# Patient Record
Sex: Male | Born: 1955
Health system: Southern US, Community
[De-identification: ages and names within clinical notes are randomized; demographics above are authoritative.]

## PROBLEM LIST (undated history)

## (undated) DIAGNOSIS — R7303 Prediabetes: Secondary | ICD-10-CM

## (undated) DIAGNOSIS — M1990 Unspecified osteoarthritis, unspecified site: Secondary | ICD-10-CM

## (undated) DIAGNOSIS — I2510 Atherosclerotic heart disease of native coronary artery without angina pectoris: Secondary | ICD-10-CM

## (undated) DIAGNOSIS — G4730 Sleep apnea, unspecified: Secondary | ICD-10-CM

## (undated) DIAGNOSIS — E039 Hypothyroidism, unspecified: Secondary | ICD-10-CM

## (undated) HISTORY — PX: VASECTOMY: SHX75

---

## 2004-06-28 ENCOUNTER — Ambulatory Visit: Payer: Self-pay | Admitting: Family Medicine

## 2007-05-10 ENCOUNTER — Ambulatory Visit: Payer: Self-pay

## 2007-05-10 ENCOUNTER — Ambulatory Visit: Payer: Self-pay | Admitting: Family Medicine

## 2007-06-21 ENCOUNTER — Emergency Department: Payer: Self-pay | Admitting: Emergency Medicine

## 2007-06-24 ENCOUNTER — Emergency Department: Payer: Self-pay | Admitting: Internal Medicine

## 2007-06-25 ENCOUNTER — Ambulatory Visit: Payer: Self-pay | Admitting: Internal Medicine

## 2008-05-05 DIAGNOSIS — N529 Male erectile dysfunction, unspecified: Secondary | ICD-10-CM | POA: Insufficient documentation

## 2008-05-08 DIAGNOSIS — M129 Arthropathy, unspecified: Secondary | ICD-10-CM | POA: Insufficient documentation

## 2008-05-08 DIAGNOSIS — M109 Gout, unspecified: Secondary | ICD-10-CM | POA: Insufficient documentation

## 2009-02-17 DIAGNOSIS — IMO0002 Reserved for concepts with insufficient information to code with codable children: Secondary | ICD-10-CM | POA: Insufficient documentation

## 2009-03-24 ENCOUNTER — Ambulatory Visit: Payer: Self-pay | Admitting: Urology

## 2009-05-10 ENCOUNTER — Ambulatory Visit: Payer: Self-pay | Admitting: Rheumatology

## 2010-05-01 HISTORY — PX: COLONOSCOPY: SHX174

## 2010-06-17 LAB — HM COLONOSCOPY

## 2011-03-22 ENCOUNTER — Ambulatory Visit: Payer: Self-pay | Admitting: Family Medicine

## 2012-08-22 ENCOUNTER — Ambulatory Visit: Payer: Self-pay | Admitting: Family Medicine

## 2013-11-18 ENCOUNTER — Ambulatory Visit: Payer: Self-pay | Admitting: Family Medicine

## 2013-12-30 HISTORY — PX: KNEE SURGERY: SHX244

## 2014-01-13 ENCOUNTER — Other Ambulatory Visit: Payer: Self-pay | Admitting: Orthopedic Surgery

## 2014-01-13 DIAGNOSIS — M25561 Pain in right knee: Secondary | ICD-10-CM

## 2014-01-20 ENCOUNTER — Ambulatory Visit
Admission: RE | Admit: 2014-01-20 | Discharge: 2014-01-20 | Disposition: A | Discharge status: Home / Self Care | Payer: 59 | Source: Ambulatory Visit | Attending: Orthopedic Surgery | Admitting: Orthopedic Surgery

## 2014-01-20 DIAGNOSIS — M25561 Pain in right knee: Secondary | ICD-10-CM

## 2014-03-30 DIAGNOSIS — G4733 Obstructive sleep apnea (adult) (pediatric): Secondary | ICD-10-CM | POA: Insufficient documentation

## 2014-07-17 LAB — HEPATIC FUNCTION PANEL
ALK PHOS: 115 U/L (ref 25–125)
ALT: 27 U/L (ref 10–40)
AST: 26 U/L (ref 14–40)
BILIRUBIN, TOTAL: 0.8 mg/dL

## 2014-07-17 LAB — TSH: TSH: 4.88 u[IU]/mL (ref 0.41–5.90)

## 2014-07-17 LAB — BASIC METABOLIC PANEL
BUN: 17 mg/dL (ref 4–21)
Creatinine: 1.1 mg/dL (ref 0.6–1.3)
GLUCOSE: 116 mg/dL
Potassium: 5 mmol/L (ref 3.4–5.3)
SODIUM: 147 mmol/L (ref 137–147)

## 2014-07-17 LAB — HEMOGLOBIN A1C: Hgb A1c MFr Bld: 5.7 % (ref 4.0–6.0)

## 2015-02-26 ENCOUNTER — Other Ambulatory Visit: Payer: Self-pay | Admitting: Family Medicine

## 2015-02-26 DIAGNOSIS — Z205 Contact with and (suspected) exposure to viral hepatitis: Secondary | ICD-10-CM

## 2015-04-02 ENCOUNTER — Ambulatory Visit (INDEPENDENT_AMBULATORY_CARE_PROVIDER_SITE_OTHER): Payer: 59 | Admitting: Family Medicine

## 2015-04-02 ENCOUNTER — Encounter: Payer: Self-pay | Admitting: Family Medicine

## 2015-04-02 VITALS — BP 110/70 | HR 86 | Temp 97.9°F | Resp 16 | Wt 306.0 lb

## 2015-04-02 DIAGNOSIS — R053 Chronic cough: Secondary | ICD-10-CM

## 2015-04-02 DIAGNOSIS — E039 Hypothyroidism, unspecified: Secondary | ICD-10-CM | POA: Insufficient documentation

## 2015-04-02 DIAGNOSIS — E7800 Pure hypercholesterolemia, unspecified: Secondary | ICD-10-CM | POA: Insufficient documentation

## 2015-04-02 DIAGNOSIS — I2510 Atherosclerotic heart disease of native coronary artery without angina pectoris: Secondary | ICD-10-CM | POA: Insufficient documentation

## 2015-04-02 DIAGNOSIS — I059 Rheumatic mitral valve disease, unspecified: Secondary | ICD-10-CM | POA: Insufficient documentation

## 2015-04-02 DIAGNOSIS — L309 Dermatitis, unspecified: Secondary | ICD-10-CM | POA: Insufficient documentation

## 2015-04-02 DIAGNOSIS — K602 Anal fissure, unspecified: Secondary | ICD-10-CM | POA: Insufficient documentation

## 2015-04-02 DIAGNOSIS — I10 Essential (primary) hypertension: Secondary | ICD-10-CM | POA: Insufficient documentation

## 2015-04-02 DIAGNOSIS — K279 Peptic ulcer, site unspecified, unspecified as acute or chronic, without hemorrhage or perforation: Secondary | ICD-10-CM | POA: Insufficient documentation

## 2015-04-02 DIAGNOSIS — R05 Cough: Secondary | ICD-10-CM

## 2015-04-02 DIAGNOSIS — R7303 Prediabetes: Secondary | ICD-10-CM | POA: Insufficient documentation

## 2015-04-02 DIAGNOSIS — F419 Anxiety disorder, unspecified: Secondary | ICD-10-CM | POA: Insufficient documentation

## 2015-04-02 DIAGNOSIS — I38 Endocarditis, valve unspecified: Secondary | ICD-10-CM | POA: Insufficient documentation

## 2015-04-02 DIAGNOSIS — M25569 Pain in unspecified knee: Secondary | ICD-10-CM | POA: Insufficient documentation

## 2015-04-02 DIAGNOSIS — M7660 Achilles tendinitis, unspecified leg: Secondary | ICD-10-CM | POA: Insufficient documentation

## 2015-04-02 DIAGNOSIS — B356 Tinea cruris: Secondary | ICD-10-CM | POA: Insufficient documentation

## 2015-04-02 MED ORDER — FLUNISOLIDE HFA 80 MCG/ACT IN AERS: 2.0000 | INHALATION_SPRAY | Freq: Two times a day (BID) | RESPIRATORY_TRACT | Status: DC

## 2015-04-02 MED ORDER — HYDROCODONE-HOMATROPINE 5-1.5 MG/5ML PO SYRP: 5.0000 mL | ORAL_SOLUTION | Freq: Three times a day (TID) | ORAL | Status: DC | PRN

## 2015-04-02 NOTE — Progress Notes (Signed)
Patient ID: Michael Reyes, male   DOB: July 13, 1955, 59 y.o.   MRN: [id number]       Patient: Michael Reyes Male    DOB: [birth year]   59 y.o.   MRN: [id number] Visit Date: 04/02/2015  Today's Provider: Vernie Murders, PA   Chief Complaint  Patient presents with  . URI   Subjective:    HPI Pt is here today because he has had cold symptoms for about 4 weeks. He has had cough, congestion, shortness of breath and wheezing. He reports that he has tried taking Mucinex and it has not helped him at all and last night he could not sleep for coughing and today he feels hot like he has a fever. Pt is concerned that his CPAP is "re-infecting" him with this cold. Occasional sore throat and ears feel stopped up.   Patient Active Problem List   Diagnosis Date Noted  . Achilles tendinitis 04/02/2015  . Anal fissure 04/02/2015  . Anxiety 04/02/2015  . Arteriosclerosis of coronary artery 04/02/2015  . Gonalgia 04/02/2015  . Dermatitis, eczematoid 04/02/2015  . Blood glucose elevated 04/02/2015  . HLD (hyperlipidemia) 04/02/2015  . BP (high blood pressure) 04/02/2015  . Adult hypothyroidism 04/02/2015  . Disorder of mitral valve 04/02/2015  . Gastroduodenal ulcer 04/02/2015  . Subclinical hypothyroidism 04/02/2015  . Dermatophytosis of groin 04/02/2015  . Heart valve disease 04/02/2015  . Exposure to hepatitis B 02/26/2015  . Obstructive apnea 03/30/2014  . Coitalgia 02/17/2009  . Arthropathia 05/08/2008  . Arthritis due to gout 05/08/2008  . Hypercholesteremia 05/05/2008  . ED (erectile dysfunction) of organic origin 05/05/2008  . Apnea, sleep 05/05/2008   Past Surgical History  Procedure Laterality Date  . Vasectomy    . Knee surgery Right 12/2013  . Colonoscopy  2012   Family History  Problem Relation Age of Onset  . Tongue cancer Mother   . Heart disease Father   . Diabetes Father   . Obesity Sister   . Cancer Sister   . Diabetes Sister   . Healthy Brother    No Known  Allergies   Previous Medications   ALLOPURINOL (ZYLOPRIM) 100 MG TABLET    Take 2 tablets by mouth  once a day as directed   ASPIRIN 81 MG TABLET       GLUCOSAMINE-CHONDROIT-VIT C-MN PO    Take by mouth.   MELOXICAM (MOBIC) 15 MG TABLET    Take by mouth.   MULTIPLE VITAMIN PO    Take by mouth.   SIMVASTATIN (ZOCOR) 40 MG TABLET    Take 1 tablet by mouth  every night    Review of Systems  Constitutional: Positive for fatigue.  HENT: Positive for congestion, ear pain, postnasal drip, rhinorrhea, sneezing and sore throat.   Eyes: Positive for pain.  Respiratory: Positive for cough, shortness of breath and wheezing.   Cardiovascular: Positive for chest pain (when he coughs from sore muscles.).  Gastrointestinal: Negative.   Endocrine: Negative.   Genitourinary: Negative.   Musculoskeletal: Negative.   Skin: Negative.   Allergic/Immunologic: Negative.   Neurological: Negative.   Hematological: Negative.   Psychiatric/Behavioral: Negative.     Social History  Substance Use Topics  . Smoking status: Never Smoker   . Smokeless tobacco: Never Used  . Alcohol Use: 0.0 oz/week    0 Standard drinks or equivalent per week     Comment: Occasionally   Objective:   BP 110/70 mmHg  Pulse 86  Temp(Src) 97.9 F (36.6  C) (Oral)  Resp 16  Wt 306 lb (138.801 kg)  SpO2 94%  Physical Exam  Constitutional: He is oriented to person, place, and time. He appears well-developed and well-nourished.  HENT:  Head: Normocephalic.  Right Ear: External ear normal.  Left Ear: External ear normal.  Nose: Nose normal.  Mouth/Throat: Oropharynx is clear and moist.  Eyes: Conjunctivae and EOM are normal.  Neck: Normal range of motion. Neck supple.  Cardiovascular: Normal rate, regular rhythm and normal heart sounds.   Pulmonary/Chest: Effort normal and breath sounds normal. He has no wheezes. He has no rales.  Abdominal: Soft. Bowel sounds are normal.  Neurological: He is alert and oriented to  person, place, and time.  Psychiatric: He has a normal mood and affect. His behavior is normal.      Assessment & Plan:     1. Persistent cough for 3 weeks or longer Onset over the past 3-4 weeks. Ticklish and no sputum production or fever. Worried about exposing his wife to any infection since she is being treated for lymphoma with chemotherapy. Will give Azithromycin prescription to start if any fever develops and add Hycodan for cough. Given Aerospan inhaler to decrease inflammation in airways. If no better in 5-6 days, should return for spirometry, chest x-ray and labs. Increase fluid intake. - HYDROcodone-homatropine (HYCODAN) 5-1.5 MG/5ML syrup; Take 5 mLs by mouth every 8 (eight) hours as needed for cough.  Dispense: 120 mL; Refill: 0 - Flunisolide HFA 80 MCG/ACT AERS; Inhale 2 Act into the lungs 2 (two) times daily.  Dispense: 5.1 g; Refill: 0       Vernie Murders, PA  [facility]

## 2015-04-16 ENCOUNTER — Telehealth: Payer: Self-pay | Admitting: Family Medicine

## 2015-04-16 NOTE — Telephone Encounter (Signed)
Pt still has some cough and wants to know if he can get a refill on the HYDROcodone-homatropine (HYCODAN) 5-1.5 MG/5ML syrup  He uses [facility] [address]  Call back is [phone]  Thanks Con Memos

## 2015-04-16 NOTE — Telephone Encounter (Signed)
Advised patient he needs follow up appointment since cough returned from last visit on 04-02-15. May need further evaluation with chest x-ray and spirometry. States he will call for appointment the first of next week.

## 2015-04-19 ENCOUNTER — Ambulatory Visit (INDEPENDENT_AMBULATORY_CARE_PROVIDER_SITE_OTHER): Payer: 59 | Admitting: Family Medicine

## 2015-04-19 ENCOUNTER — Encounter: Payer: Self-pay | Admitting: Family Medicine

## 2015-04-19 VITALS — BP 136/84 | HR 80 | Temp 97.9°F | Resp 20 | Ht 75.0 in | Wt 300.0 lb

## 2015-04-19 DIAGNOSIS — R05 Cough: Secondary | ICD-10-CM

## 2015-04-19 DIAGNOSIS — G4733 Obstructive sleep apnea (adult) (pediatric): Secondary | ICD-10-CM

## 2015-04-19 DIAGNOSIS — R059 Cough, unspecified: Secondary | ICD-10-CM

## 2015-04-19 MED ORDER — HYDROCODONE-HOMATROPINE 5-1.5 MG/5ML PO SYRP: 5.0000 mL | ORAL_SOLUTION | Freq: Three times a day (TID) | ORAL | Status: DC | PRN

## 2015-04-19 NOTE — Progress Notes (Signed)
Patient ID: Michael Reyes, male   DOB: [birth year], 59 y.o.   MRN: [id number]        Patient: Michael Reyes Male    DOB: 1955/11/29   59 y.o.   MRN: [id number] Visit Date: 04/19/2015  Today's Provider: Vernie Murders, PA   Chief Complaint  Patient presents with  . URI   Subjective:    URI  This is a new problem. The current episode started 1 to 4 weeks ago (03/05/2015). The problem has been unchanged. There has been no fever. Associated symptoms include congestion, coughing, rhinorrhea, a sore throat and wheezing. He has tried nothing for the symptoms.  Patient reports that his cough is worse at night. Patient reports he has used Flunisolide inhaler with mild relief. Patient reports persistent dry cough that improved with Aerospan and Hycodan..  Patient Active Problem List   Diagnosis Date Noted  . Achilles tendinitis 04/02/2015  . Anal fissure 04/02/2015  . Anxiety 04/02/2015  . Arteriosclerosis of coronary artery 04/02/2015  . Gonalgia 04/02/2015  . Dermatitis, eczematoid 04/02/2015  . Blood glucose elevated 04/02/2015  . HLD (hyperlipidemia) 04/02/2015  . BP (high blood pressure) 04/02/2015  . Adult hypothyroidism 04/02/2015  . Disorder of mitral valve 04/02/2015  . Gastroduodenal ulcer 04/02/2015  . Subclinical hypothyroidism 04/02/2015  . Dermatophytosis of groin 04/02/2015  . Heart valve disease 04/02/2015  . Exposure to hepatitis B 02/26/2015  . Obstructive apnea 03/30/2014  . Coitalgia 02/17/2009  . Arthropathia 05/08/2008  . Arthritis due to gout 05/08/2008  . Hypercholesteremia 05/05/2008  . ED (erectile dysfunction) of organic origin 05/05/2008  . Apnea, sleep 05/05/2008   Past Surgical History  Procedure Laterality Date  . Vasectomy    . Knee surgery Right 12/2013  . Colonoscopy  2012   Family History  Problem Relation Age of Onset  . Tongue cancer Mother   . Heart disease Father   . Diabetes Father   . Obesity Sister   . Cancer Sister   .  Diabetes Sister   . Healthy Brother    No Known Allergies   Previous Medications   ALLOPURINOL (ZYLOPRIM) 100 MG TABLET    Take 2 tablets by mouth  once a day as directed   ASPIRIN 81 MG TABLET    Take 81 mg by mouth daily.    FLUNISOLIDE HFA 80 MCG/ACT AERS    Inhale 2 Act into the lungs 2 (two) times daily.   GLUCOSAMINE-CHONDROIT-VIT C-MN PO    Take 2 tablets by mouth daily.    MULTIPLE VITAMIN PO    Take 1 tablet by mouth daily.    SIMVASTATIN (ZOCOR) 40 MG TABLET    Take 1 tablet by mouth  every night    Review of Systems  Constitutional: Negative.   HENT: Positive for congestion, rhinorrhea and sore throat.   Respiratory: Positive for cough and wheezing.   Cardiovascular: Negative.     Social History  Substance Use Topics  . Smoking status: Never Smoker   . Smokeless tobacco: Never Used  . Alcohol Use: 0.0 oz/week    0 Standard drinks or equivalent per week     Comment: Occasionally   Objective:   BP 136/84 mmHg  Pulse 80  Temp(Src) 97.9 F (36.6 C) (Oral)  Resp 20  Wt 300 lb (136.079 kg)  SpO2 97%  Physical Exam  Constitutional: He is oriented to person, place, and time. He appears well-developed and well-nourished.  HENT:  Head: Normocephalic.  Right Ear: External  ear normal.  Left Ear: External ear normal.  Nose: Nose normal.  Mouth/Throat: Oropharynx is clear and moist.  Eyes: Conjunctivae and EOM are normal.  Neck: Normal range of motion. Neck supple.  Cardiovascular: Normal rate, regular rhythm and normal heart sounds.   Pulmonary/Chest: Effort normal and breath sounds normal.  Abdominal: Soft. Bowel sounds are normal.  Neurological: He is alert and oriented to person, place, and time.  Skin: No rash noted.  Psychiatric: He has a normal mood and affect. His behavior is normal. Thought content normal.      Assessment & Plan:     1. Cough Recurrent and persistent hacking cough over the past 4-6 weeks. Was better with Aerospan and Hycodan.  Spirometry normal today. Will refill Aerospan and may get Hycodan if not getting enough relief with Tussin-DM. Increase fluid intake, proceed with labs and CXR. Recheck pending reports.  - Spirometry with Graph - DG Chest 2 View - CBC with Differential/Platelet - COMPLETE METABOLIC PANEL WITH GFR - HYDROcodone-homatropine (HYCODAN) 5-1.5 MG/5ML syrup; Take 5 mLs by mouth every 8 (eight) hours as needed for cough.  Dispense: 120 mL; Refill: 0  2. Obstructive apnea Well controlled with CPAP each night.       Vernie Murders, PA  [facility]

## 2015-04-20 ENCOUNTER — Telehealth: Payer: Self-pay

## 2015-04-20 ENCOUNTER — Ambulatory Visit
Admission: RE | Admit: 2015-04-20 | Discharge: 2015-04-20 | Disposition: A | Discharge status: Home / Self Care | Payer: 59 | Source: Ambulatory Visit | Attending: Family Medicine | Admitting: Family Medicine

## 2015-04-20 DIAGNOSIS — R05 Cough: Secondary | ICD-10-CM | POA: Insufficient documentation

## 2015-04-20 DIAGNOSIS — R918 Other nonspecific abnormal finding of lung field: Secondary | ICD-10-CM | POA: Diagnosis not present

## 2015-04-20 NOTE — Telephone Encounter (Signed)
-----   Message from [place], [place] sent at 04/20/2015  1:24 PM EST ----- Minimal area of atelectasis (may be due to inflammation, infection or old scar). Proceed with present medication and awaiting final report of blood tests.

## 2015-04-20 NOTE — Telephone Encounter (Signed)
Patient advised as directed below. Patient verbalized understanding.  

## 2015-04-21 LAB — CBC WITH DIFFERENTIAL/PLATELET
BASOS ABS: 0 10*3/uL (ref 0.0–0.2)
Basos: 0 %
EOS (ABSOLUTE): 0.2 10*3/uL (ref 0.0–0.4)
Eos: 4 %
Hematocrit: 46.6 % (ref 37.5–51.0)
Hemoglobin: 16.5 g/dL (ref 12.6–17.7)
IMMATURE GRANS (ABS): 0 10*3/uL (ref 0.0–0.1)
IMMATURE GRANULOCYTES: 1 %
LYMPHS: 30 %
Lymphocytes Absolute: 1.6 10*3/uL (ref 0.7–3.1)
MCH: 32.9 pg (ref 26.6–33.0)
MCHC: 35.4 g/dL (ref 31.5–35.7)
MCV: 93 fL (ref 79–97)
Monocytes Absolute: 0.7 10*3/uL (ref 0.1–0.9)
Monocytes: 14 %
NEUTROS PCT: 51 %
Neutrophils Absolute: 2.8 10*3/uL (ref 1.4–7.0)
PLATELETS: 233 10*3/uL (ref 150–379)
RBC: 5.02 x10E6/uL (ref 4.14–5.80)
RDW: 13.1 % (ref 12.3–15.4)
WBC: 5.3 10*3/uL (ref 3.4–10.8)

## 2015-04-21 LAB — COMPREHENSIVE METABOLIC PANEL
ALT: 38 IU/L (ref 0–44)
AST: 24 IU/L (ref 0–40)
Albumin/Globulin Ratio: 2 (ref 1.1–2.5)
Albumin: 4.6 g/dL (ref 3.6–4.8)
Alkaline Phosphatase: 101 IU/L (ref 39–117)
BUN/Creatinine Ratio: 16 (ref 10–22)
BUN: 17 mg/dL (ref 8–27)
Bilirubin Total: 0.4 mg/dL (ref 0.0–1.2)
CALCIUM: 9.3 mg/dL (ref 8.6–10.2)
CO2: 24 mmol/L (ref 18–29)
CREATININE: 1.07 mg/dL (ref 0.76–1.27)
Chloride: 102 mmol/L (ref 96–106)
GFR, EST AFRICAN AMERICAN: 86 mL/min/{1.73_m2} (ref >59)
GFR, EST NON AFRICAN AMERICAN: 75 mL/min/{1.73_m2} (ref >59)
Globulin, Total: 2.3 g/dL (ref 1.5–4.5)
Glucose: 113 mg/dL — ABNORMAL HIGH (ref 65–99)
POTASSIUM: 4.5 mmol/L (ref 3.5–5.2)
Sodium: 142 mmol/L (ref 134–144)
TOTAL PROTEIN: 6.9 g/dL (ref 6.0–8.5)

## 2015-04-21 LAB — HEPATITIS B SURFACE ANTIGEN: HEP B S AG: NEGATIVE

## 2015-04-21 LAB — HEPATITIS B CORE ANTIBODY, TOTAL: HEP B C TOTAL AB: NEGATIVE

## 2015-04-21 LAB — HEPATITIS B SURFACE ANTIBODY,QUALITATIVE: Hep B Surface Ab, Qual: NONREACTIVE

## 2015-04-29 ENCOUNTER — Telehealth: Payer: Self-pay

## 2015-04-29 NOTE — Telephone Encounter (Signed)
LMTCB

## 2015-04-29 NOTE — Telephone Encounter (Signed)
-----   Message from [place], [place] sent at 04/26/2015  1:44 PM EST ----- All blood tests normal. If cough no better with recent medications, will need to schedule pulmonology referral for further evaluation.

## 2015-04-29 NOTE — Telephone Encounter (Signed)
Patient advised as directed below. Patient verbalized understanding.  

## 2015-07-27 DIAGNOSIS — I872 Venous insufficiency (chronic) (peripheral): Secondary | ICD-10-CM | POA: Insufficient documentation

## 2015-08-04 ENCOUNTER — Telehealth: Payer: Self-pay | Admitting: Family Medicine

## 2015-08-04 DIAGNOSIS — E785 Hyperlipidemia, unspecified: Secondary | ICD-10-CM

## 2015-08-04 DIAGNOSIS — M109 Gout, unspecified: Secondary | ICD-10-CM

## 2015-08-04 NOTE — Telephone Encounter (Signed)
Ok to order. Thanks.   

## 2015-08-04 NOTE — Telephone Encounter (Signed)
Pt is requesting a lab slip for cholesterol panel and uric acid level checked.  Pt states Dr Nehemiah Massed is requesting these done.  CB#[phone]/MW

## 2015-08-04 NOTE — Telephone Encounter (Signed)
Labs ordered, lab slipped print at front desk for pick up. Patient is aware.

## 2015-08-05 DIAGNOSIS — E785 Hyperlipidemia, unspecified: Secondary | ICD-10-CM | POA: Diagnosis not present

## 2015-08-05 DIAGNOSIS — M109 Gout, unspecified: Secondary | ICD-10-CM | POA: Diagnosis not present

## 2015-08-06 ENCOUNTER — Telehealth: Payer: Self-pay

## 2015-08-06 LAB — LIPID PANEL
CHOL/HDL RATIO: 3.8 ratio (ref 0.0–5.0)
Cholesterol, Total: 149 mg/dL (ref 100–199)
HDL: 39 mg/dL — AB (ref >39)
LDL Calculated: 78 mg/dL (ref 0–99)
Triglycerides: 162 mg/dL — ABNORMAL HIGH (ref 0–149)
VLDL CHOLESTEROL CAL: 32 mg/dL (ref 5–40)

## 2015-08-06 LAB — URIC ACID: URIC ACID: 4.9 mg/dL (ref 3.7–8.6)

## 2015-08-06 NOTE — Telephone Encounter (Signed)
-----   Message from Margarita Rana, MD sent at 08/06/2015  9:36 AM EDT ----- Labs stable. Cholesterol is 149 and uric acid at 4.9. Both at goal. Continue current medication and plan of care.  Thanks.

## 2015-08-06 NOTE — Telephone Encounter (Signed)
LMTCB 08/06/2015  Thanks,   -Mickel Baas

## 2015-08-09 ENCOUNTER — Telehealth: Payer: Self-pay | Admitting: Family Medicine

## 2015-08-09 NOTE — Telephone Encounter (Signed)
See other message-aa 

## 2015-08-09 NOTE — Telephone Encounter (Signed)
Pt advised-aa 

## 2015-08-09 NOTE — Telephone Encounter (Signed)
Pt states he is returning a call from one of Dr. Sharyon Medicus nurse's. Pt would like a nurse to please return his call @CB # [phone].   Thanks CC

## 2015-08-23 ENCOUNTER — Other Ambulatory Visit: Payer: Self-pay

## 2015-08-23 DIAGNOSIS — E7800 Pure hypercholesterolemia, unspecified: Secondary | ICD-10-CM

## 2015-08-23 MED ORDER — ALLOPURINOL 100 MG PO TABS: ORAL_TABLET | ORAL | Status: DC

## 2015-08-23 MED ORDER — SIMVASTATIN 40 MG PO TABS: 40.0000 mg | ORAL_TABLET | Freq: Every day | ORAL | Status: DC

## 2015-10-01 ENCOUNTER — Encounter: Payer: Self-pay | Admitting: Family Medicine

## 2015-10-01 ENCOUNTER — Ambulatory Visit
Admission: RE | Admit: 2015-10-01 | Discharge: 2015-10-01 | Disposition: A | Discharge status: Home / Self Care | Payer: BLUE CROSS/BLUE SHIELD | Source: Ambulatory Visit | Attending: Family Medicine | Admitting: Family Medicine

## 2015-10-01 ENCOUNTER — Telehealth: Payer: Self-pay

## 2015-10-01 ENCOUNTER — Ambulatory Visit (INDEPENDENT_AMBULATORY_CARE_PROVIDER_SITE_OTHER): Payer: BLUE CROSS/BLUE SHIELD | Admitting: Family Medicine

## 2015-10-01 ENCOUNTER — Other Ambulatory Visit: Payer: Self-pay

## 2015-10-01 VITALS — BP 106/64 | HR 80 | Temp 98.1°F | Resp 16 | Wt 303.0 lb

## 2015-10-01 DIAGNOSIS — R05 Cough: Secondary | ICD-10-CM

## 2015-10-01 DIAGNOSIS — R053 Chronic cough: Secondary | ICD-10-CM | POA: Insufficient documentation

## 2015-10-01 DIAGNOSIS — R059 Cough, unspecified: Secondary | ICD-10-CM | POA: Insufficient documentation

## 2015-10-01 DIAGNOSIS — J309 Allergic rhinitis, unspecified: Secondary | ICD-10-CM | POA: Diagnosis not present

## 2015-10-01 MED ORDER — MONTELUKAST SODIUM 10 MG PO TABS: 10.0000 mg | ORAL_TABLET | Freq: Every day | ORAL | Status: DC

## 2015-10-01 MED ORDER — LORATADINE 10 MG PO TABS: 10.0000 mg | ORAL_TABLET | Freq: Every day | ORAL | Status: DC

## 2015-10-01 NOTE — Telephone Encounter (Signed)
Pt advised.   Thanks,   -Laura  

## 2015-10-01 NOTE — Progress Notes (Signed)
Subjective:    Patient ID: Michael Reyes, male    DOB: 1955/10/03, 60 y.o.   MRN: [id number]  Cough The current episode started more than 1 month ago (x 7 months). The problem occurs constantly. The cough is non-productive. Associated symptoms include postnasal drip and a sore throat. Pertinent negatives include no chest pain, chills, ear congestion, ear pain, fever, headaches, heartburn, hemoptysis, myalgias, nasal congestion, rhinorrhea, shortness of breath, sweats, weight loss or wheezing. Nothing aggravates the symptoms. Treatments tried: Gailen Shelter. The treatment provided no relief (Pt reports he quit taking these medications because they were ineffective). His past medical history is significant for environmental allergies (seasonal; was on allergy shots for this 4 years ago. Had these shots for about 10 years.). There is no history of asthma or COPD.  Pt does not have a H/O smoking or second hand smoke. Pt saw Simona Huh in December for this problem. Had a normal spirometry at that time, and pt's CXR showed minimal area of atelectasis. Labs were normal. Simona Huh suggested pt see pulmonology.     Review of Systems  Constitutional: Negative for fever, chills and weight loss.  HENT: Positive for postnasal drip and sore throat. Negative for ear pain and rhinorrhea.   Respiratory: Positive for cough. Negative for hemoptysis, shortness of breath and wheezing.   Cardiovascular: Negative for chest pain.  Gastrointestinal: Negative for heartburn.  Musculoskeletal: Negative for myalgias.  Allergic/Immunologic: Positive for environmental allergies (seasonal; was on allergy shots for this 4 years ago. Had these shots for about 10 years.).  Neurological: Negative for headaches.   BP 106/64 mmHg  Pulse 80  Temp(Src) 98.1 F (36.7 C) (Oral)  Resp 16  Wt 303 lb (137.44 kg)  SpO2 93%   Patient Active Problem List   Diagnosis Date Noted  . Venous insufficiency of leg 07/27/2015  . Achilles  tendinitis 04/02/2015  . Anal fissure 04/02/2015  . Anxiety 04/02/2015  . Arteriosclerosis of coronary artery 04/02/2015  . Gonalgia 04/02/2015  . Dermatitis, eczematoid 04/02/2015  . Blood glucose elevated 04/02/2015  . HLD (hyperlipidemia) 04/02/2015  . BP (high blood pressure) 04/02/2015  . Adult hypothyroidism 04/02/2015  . Disorder of mitral valve 04/02/2015  . Gastroduodenal ulcer 04/02/2015  . Subclinical hypothyroidism 04/02/2015  . Dermatophytosis of groin 04/02/2015  . Heart valve disease 04/02/2015  . Exposure to hepatitis B 02/26/2015  . Obstructive apnea 03/30/2014  . Coitalgia 02/17/2009  . Arthropathia 05/08/2008  . Arthritis due to gout 05/08/2008  . Hypercholesteremia 05/05/2008  . ED (erectile dysfunction) of organic origin 05/05/2008  . Apnea, sleep 05/05/2008   No past medical history on file. Current Outpatient Prescriptions on File Prior to Visit  Medication Sig  . allopurinol (ZYLOPRIM) 100 MG tablet Take 2 tablets by mouth  once a day as directed  . aspirin 81 MG tablet Take 81 mg by mouth daily.   Marland Kitchen GLUCOSAMINE-CHONDROIT-VIT C-MN PO Take 2 tablets by mouth daily.   . MULTIPLE VITAMIN PO Take 1 tablet by mouth daily.   . simvastatin (ZOCOR) 40 MG tablet Take 1 tablet (40 mg total) by mouth daily.   No current facility-administered medications on file prior to visit.   No Known Allergies Past Surgical History  Procedure Laterality Date  . Vasectomy    . Knee surgery Right 12/2013  . Colonoscopy  2012   Social History   Social History  . Marital Status: Married    Spouse Name: N/A  . Number of Children: N/A  .  Years of Education: N/A   Occupational History  . Not on file.   Social History Main Topics  . Smoking status: Never Smoker   . Smokeless tobacco: Never Used  . Alcohol Use: 0.0 oz/week    0 Standard drinks or equivalent per week     Comment: Occasionally  . Drug Use: No  . Sexual Activity: Not on file   Other Topics Concern    . Not on file   Social History Narrative   Family History  Problem Relation Age of Onset  . Tongue cancer Mother   . Heart disease Father   . Diabetes Father   . Obesity Sister   . Cancer Sister   . Diabetes Sister   . Healthy Brother        Objective:   Physical Exam  Constitutional: He appears well-developed and well-nourished.  HENT:  Head: Normocephalic and atraumatic.  Right Ear: Tympanic membrane normal.  Left Ear: Tympanic membrane normal.  Nose: Nose normal.  Mouth/Throat: Oropharynx is clear and moist.  Psychiatric: He has a normal mood and affect. His behavior is normal.  BP 106/64 mmHg  Pulse 80  Temp(Src) 98.1 F (36.7 C) (Oral)  Resp 16  Wt 303 lb (137.44 kg)  SpO2 93%     Assessment & Plan:  1. Allergic rhinitis, unspecified allergic rhinitis type New problem. Most likely cause of cough. Has H/O AR. Is c/o skin histamine reactions. Start Singulair and Claritin as below.  Patient instructed to call back if condition worsens or does not improve.    - montelukast (SINGULAIR) 10 MG tablet; Take 1 tablet (10 mg total) by mouth at bedtime.  Dispense: 30 tablet; Refill: 3 - loratadine (CLARITIN) 10 MG tablet; Take 1 tablet (10 mg total) by mouth daily.  Dispense: 30 tablet; Refill: 11  2. Chronic cough Suspect is related to AR. Spirometry WNL. Will order CXR to R/O pulmonary disease.  If normal, but cough does not improve, will order CT scan.   - Spirometry with Graph - DG Chest 2 View; Future   Patient seen and examined by Jerrell Belfast, MD, and note scribed by Renaldo Fiddler, CMA.   I have reviewed the document for accuracy and completeness and I agree with above. Jerrell Belfast, MD   Margarita Rana, MD

## 2015-10-01 NOTE — Telephone Encounter (Signed)
-----   Message from Margarita Rana, MD sent at 10/01/2015  3:28 PM EDT ----- CXR normal. Please notify patient. Would recommend proceed with chest CT if cough does not resolve with treatment. Thanks.

## 2015-10-18 ENCOUNTER — Telehealth: Payer: Self-pay | Admitting: Family Medicine

## 2015-10-18 ENCOUNTER — Telehealth: Payer: Self-pay

## 2015-10-18 ENCOUNTER — Ambulatory Visit (INDEPENDENT_AMBULATORY_CARE_PROVIDER_SITE_OTHER): Payer: BLUE CROSS/BLUE SHIELD | Admitting: Family Medicine

## 2015-10-18 ENCOUNTER — Encounter: Payer: Self-pay | Admitting: Family Medicine

## 2015-10-18 ENCOUNTER — Ambulatory Visit: Payer: Self-pay | Admitting: Family Medicine

## 2015-10-18 VITALS — BP 126/76 | HR 72 | Temp 97.5°F | Resp 16 | Wt 309.0 lb

## 2015-10-18 DIAGNOSIS — R05 Cough: Secondary | ICD-10-CM | POA: Diagnosis not present

## 2015-10-18 DIAGNOSIS — R053 Chronic cough: Secondary | ICD-10-CM

## 2015-10-18 MED ORDER — OMEPRAZOLE 20 MG PO CPDR: 20.0000 mg | DELAYED_RELEASE_CAPSULE | Freq: Two times a day (BID) | ORAL | Status: DC

## 2015-10-18 NOTE — Telephone Encounter (Signed)
Pt calledsaying he was bringing his mother in law in at 3:30 today.  He was supposed to fu with you.  He wants to know if you can see him when he brings her in so he does not have to come in twice.  There is not an opening unless we use the 3:45 blocked space.     Please advise.  His call back is [phone].  ThanksTeri

## 2015-10-18 NOTE — Progress Notes (Signed)
Patient: Michael Reyes Male    DOB: 07-Dec-1955   60 y.o.   MRN: [id number] Visit Date: 10/18/2015  Today's Provider: Margarita Rana, MD   Chief Complaint  Patient presents with  . Cough    Chronic issue   Subjective:    Cough This is a chronic problem. The current episode started more than 1 month ago. The problem has been unchanged. The cough is non-productive. Associated symptoms include shortness of breath (Only during a coughing spell). Pertinent negatives include no chest pain, chills, ear congestion, ear pain, fever, headaches, hemoptysis, nasal congestion, postnasal drip, rhinorrhea, sore throat, sweats, weight loss or wheezing.   Patient Active Problem List   Diagnosis Date Noted  . Allergic rhinitis 10/01/2015  . Chronic cough 10/01/2015  . Venous insufficiency of leg 07/27/2015  . Achilles tendinitis 04/02/2015  . Anal fissure 04/02/2015  . Anxiety 04/02/2015  . Arteriosclerosis of coronary artery 04/02/2015  . Gonalgia 04/02/2015  . Dermatitis, eczematoid 04/02/2015  . Blood glucose elevated 04/02/2015  . HLD (hyperlipidemia) 04/02/2015  . BP (high blood pressure) 04/02/2015  . Adult hypothyroidism 04/02/2015  . Disorder of mitral valve 04/02/2015  . Gastroduodenal ulcer 04/02/2015  . Subclinical hypothyroidism 04/02/2015  . Dermatophytosis of groin 04/02/2015  . Heart valve disease 04/02/2015  . Exposure to hepatitis B 02/26/2015  . Obstructive apnea 03/30/2014  . Coitalgia 02/17/2009  . Arthropathia 05/08/2008  . Arthritis due to gout 05/08/2008  . Hypercholesteremia 05/05/2008  . ED (erectile dysfunction) of organic origin 05/05/2008  . Apnea, sleep 05/05/2008   No past medical history on file. Current Outpatient Prescriptions on File Prior to Visit  Medication Sig  . allopurinol (ZYLOPRIM) 100 MG tablet Take 2 tablets by mouth  once a day as directed  . aspirin 81 MG tablet Take 81 mg by mouth daily.   Marland Kitchen GLUCOSAMINE-CHONDROIT-VIT C-MN PO  Take 2 tablets by mouth daily.   . meloxicam (MOBIC) 15 MG tablet Take by mouth.  . MULTIPLE VITAMIN PO Take 1 tablet by mouth daily.   . simvastatin (ZOCOR) 40 MG tablet Take 1 tablet (40 mg total) by mouth daily.   No current facility-administered medications on file prior to visit.   No Known Allergies Past Surgical History  Procedure Laterality Date  . Vasectomy    . Knee surgery Right 12/2013  . Colonoscopy  2012   Social History   Social History  . Marital Status: Married    Spouse Name: N/A  . Number of Children: N/A  . Years of Education: N/A   Occupational History  . Not on file.   Social History Main Topics  . Smoking status: Never Smoker   . Smokeless tobacco: Never Used  . Alcohol Use: 0.0 oz/week    0 Standard drinks or equivalent per week     Comment: Occasionally  . Drug Use: No  . Sexual Activity: Not on file   Other Topics Concern  . Not on file   Social History Narrative   Family History  Problem Relation Age of Onset  . Tongue cancer Mother   . Heart disease Father   . Diabetes Father   . Obesity Sister   . Cancer Sister   . Diabetes Sister   . Healthy Brother      No Known Allergies Current Meds  Medication Sig  . allopurinol (ZYLOPRIM) 100 MG tablet Take 2 tablets by mouth  once a day as directed  . aspirin 81  MG tablet Take 81 mg by mouth daily.   Marland Kitchen GLUCOSAMINE-CHONDROIT-VIT C-MN PO Take 2 tablets by mouth daily.   . meloxicam (MOBIC) 15 MG tablet Take by mouth.  . MULTIPLE VITAMIN PO Take 1 tablet by mouth daily.   . simvastatin (ZOCOR) 40 MG tablet Take 1 tablet (40 mg total) by mouth daily.    Review of Systems  Constitutional: Negative.  Negative for fever, chills and weight loss.  HENT: Negative.  Negative for ear pain, postnasal drip, rhinorrhea and sore throat.   Respiratory: Positive for cough and shortness of breath (Only during a coughing spell). Negative for apnea, hemoptysis, choking, chest tightness, wheezing and  stridor.   Cardiovascular: Negative.  Negative for chest pain.  Gastrointestinal: Negative.   Neurological: Negative for dizziness, light-headedness and headaches.    Social History  Substance Use Topics  . Smoking status: Never Smoker   . Smokeless tobacco: Never Used  . Alcohol Use: 0.0 oz/week    0 Standard drinks or equivalent per week     Comment: Occasionally   Objective:   BP 126/76 mmHg  Pulse 72  Temp(Src) 97.5 F (36.4 C) (Oral)  Resp 16  Wt 309 lb (140.161 kg)  SpO2 95%  Physical Exam  Constitutional: He is oriented to person, place, and time. He appears well-developed and well-nourished.  Cardiovascular: Normal rate and regular rhythm.   Pulmonary/Chest: Effort normal and breath sounds normal.  Neurological: He is alert and oriented to person, place, and time.  Skin: Skin is warm and dry.  Psychiatric: He has a normal mood and affect. His behavior is normal. Judgment and thought content normal.      Assessment & Plan:      1. Chronic cough Still an ongoing issue.  Allergy medications did not help.  Will try Omeprazole as below; also obtain a chest CT to rule out malignancy.  Further plan pending these results.   - omeprazole (PRILOSEC) 20 MG capsule; Take 1 capsule (20 mg total) by mouth 2 (two) times daily before a meal.  Dispense: 60 capsule; Refill: 1 - CT Chest Wo Contrast  Patient was seen and examined by Jerrell Belfast, MD, and note scribed by Ashley Royalty, CMA.   I have reviewed the document for accuracy and completeness and I agree with above. - Jerrell Belfast, MD      Margarita Rana, MD  [facility]

## 2015-10-18 NOTE — Telephone Encounter (Signed)
(  See previous note)  Apt made for today (10/18/2015) at 3:45pm.  Pt advised.   Thanks,   -Mickel Baas

## 2015-10-18 NOTE — Telephone Encounter (Signed)
Ok to put in today. Thanks.

## 2015-10-27 ENCOUNTER — Ambulatory Visit
Admission: RE | Admit: 2015-10-27 | Discharge: 2015-10-27 | Disposition: A | Discharge status: Home / Self Care | Payer: BLUE CROSS/BLUE SHIELD | Source: Ambulatory Visit | Attending: Family Medicine | Admitting: Family Medicine

## 2015-10-27 ENCOUNTER — Telehealth: Payer: Self-pay

## 2015-10-27 DIAGNOSIS — R05 Cough: Secondary | ICD-10-CM | POA: Diagnosis not present

## 2015-10-27 NOTE — Telephone Encounter (Signed)
-----   Message from Margarita Rana, MD sent at 10/27/2015  2:49 PM EDT ----- Normal CT scan. Thanks.

## 2015-10-27 NOTE — Telephone Encounter (Signed)
Patient advised as below.  

## 2015-11-22 ENCOUNTER — Ambulatory Visit (INDEPENDENT_AMBULATORY_CARE_PROVIDER_SITE_OTHER): Payer: BLUE CROSS/BLUE SHIELD | Admitting: Family Medicine

## 2015-11-22 ENCOUNTER — Encounter: Payer: Self-pay | Admitting: Family Medicine

## 2015-11-22 VITALS — BP 126/70 | HR 84 | Temp 98.7°F | Wt 301.0 lb

## 2015-11-22 DIAGNOSIS — J069 Acute upper respiratory infection, unspecified: Secondary | ICD-10-CM

## 2015-11-22 MED ORDER — HYDROCODONE-HOMATROPINE 5-1.5 MG/5ML PO SYRP: ORAL_SOLUTION | ORAL | 0 refills | Status: DC

## 2015-11-22 NOTE — Progress Notes (Signed)
Subjective:     Patient ID: Michael Reyes, male   DOB: 03-27-1956, 60 y.o.   MRN: [id number]  HPI  Chief Complaint  Patient presents with  . URI  Reports symptoms onset 7/19. Reports cough is not productive and he has only mild sinus congestion. Reports sweats but no fever. Has been taking Mucinex, Dayquil and Nyquil for his sx. Has a hx of chronic cough which he states was fully worked up.   Review of Systems     Objective:   Physical Exam  Constitutional: He appears well-developed and well-nourished. No distress.  Ears: T.M's intact without inflammation Sinuses: non-tender Throat: no tonsillar enlargement or exudate Neck: enlarged left anterior cervical node Lungs: clear     Assessment:    1. Upper respiratory infection - HYDROcodone-homatropine (HYCODAN) 5-1.5 MG/5ML syrup; 5 ml 4-6 hours as needed for cough  Dispense: 240 mL; Refill: 0    Plan:   Discussed otc medication and natural history of his illness.

## 2015-11-22 NOTE — Patient Instructions (Signed)
Continue Mucinex and decongestants. May also use Delsym for cough.

## 2015-11-29 ENCOUNTER — Telehealth: Payer: Self-pay | Admitting: Family Medicine

## 2015-11-29 ENCOUNTER — Other Ambulatory Visit: Payer: Self-pay | Admitting: Family Medicine

## 2015-11-29 DIAGNOSIS — J069 Acute upper respiratory infection, unspecified: Secondary | ICD-10-CM

## 2015-11-29 MED ORDER — FLUNISOLIDE HFA 80 MCG/ACT IN AERS: INHALATION_SPRAY | RESPIRATORY_TRACT | 1 refills | Status: DC

## 2015-11-29 NOTE — Telephone Encounter (Signed)
Day  #12 of URI sx states he primarily has a dry cough at night which is helped by left over steroid inhaler and cough syrup. Occasionally reports yellow appearing sputum. Will refill flunisolone inhaler. He will call if not continuing to improve over the next week.

## 2015-11-29 NOTE — Telephone Encounter (Signed)
Pt contacted office for refill request on the following medications:  Aerospan or Flunisolide inhaler for a cough.  [place].  CB#[phone]/MW

## 2015-11-30 ENCOUNTER — Telehealth: Payer: Self-pay | Admitting: Family Medicine

## 2015-11-30 ENCOUNTER — Other Ambulatory Visit: Payer: Self-pay | Admitting: Family Medicine

## 2015-11-30 DIAGNOSIS — J069 Acute upper respiratory infection, unspecified: Secondary | ICD-10-CM

## 2015-11-30 MED ORDER — CEFDINIR 300 MG PO CAPS: 300.0000 mg | ORAL_CAPSULE | Freq: Two times a day (BID) | ORAL | 0 refills | Status: DC

## 2015-11-30 NOTE — Telephone Encounter (Signed)
rx available for pickup

## 2015-11-30 NOTE — Telephone Encounter (Signed)
Patient was last seen in office for URI on 11/22/15, please advise. KW

## 2015-11-30 NOTE — Telephone Encounter (Signed)
Pt states things with his cold are not any better.  Pt is leaving to go out of town tomorrow and is requesting a written Rx for an antibiotic to take on vacation with him.  CB#[phone]/MW

## 2015-12-01 NOTE — Telephone Encounter (Signed)
[id number] 

## 2015-12-14 ENCOUNTER — Telehealth: Payer: Self-pay | Admitting: Family Medicine

## 2015-12-14 ENCOUNTER — Other Ambulatory Visit: Payer: Self-pay | Admitting: Family Medicine

## 2015-12-14 DIAGNOSIS — R05 Cough: Secondary | ICD-10-CM

## 2015-12-14 DIAGNOSIS — R059 Cough, unspecified: Secondary | ICD-10-CM

## 2015-12-14 MED ORDER — BENZONATATE 100 MG PO CAPS: ORAL_CAPSULE | ORAL | 0 refills | Status: DC

## 2015-12-14 NOTE — Telephone Encounter (Signed)
States he does not feel sick but cough persists primarily during the day occasionally productive of white phlegm. Has completed 10 day course of Omnicef with little change. Has been also taking omeprazole twice daily. Discussed trying Tessalon Perles for probable post-viral cough. If not improving would try again with PPI and Carafate.

## 2015-12-14 NOTE — Telephone Encounter (Signed)
Pt states he is being treated by Mikki Santee for a cold and cough.  Pt wanted to give Mikki Santee an update.  Pt has completed the antibiotics Sunday.  Pt still has a deep chest cough and still having white phlegm.  Pt has taking the Rx Omeprazole and tilted bed for 2 weeks and this has no effect on the cough.  Pt wife has had the same cough and she is in the hospital  [facility] right now with bacteria base pneumonia.  Pt is asking what is the next step.  CB#[phone]/MW

## 2015-12-14 NOTE — Telephone Encounter (Signed)
Please review last note and advise. KW

## 2015-12-18 ENCOUNTER — Other Ambulatory Visit: Payer: Self-pay | Admitting: Family Medicine

## 2015-12-18 DIAGNOSIS — R053 Chronic cough: Secondary | ICD-10-CM

## 2015-12-18 DIAGNOSIS — R05 Cough: Secondary | ICD-10-CM

## 2015-12-25 DIAGNOSIS — L03019 Cellulitis of unspecified finger: Secondary | ICD-10-CM | POA: Diagnosis not present

## 2016-01-25 ENCOUNTER — Encounter: Payer: Self-pay | Admitting: Family Medicine

## 2016-01-25 ENCOUNTER — Ambulatory Visit (INDEPENDENT_AMBULATORY_CARE_PROVIDER_SITE_OTHER): Payer: BLUE CROSS/BLUE SHIELD | Admitting: Family Medicine

## 2016-01-25 ENCOUNTER — Ambulatory Visit
Admission: RE | Admit: 2016-01-25 | Discharge: 2016-01-25 | Disposition: A | Discharge status: Home / Self Care | Payer: BLUE CROSS/BLUE SHIELD | Source: Ambulatory Visit | Attending: Family Medicine | Admitting: Family Medicine

## 2016-01-25 VITALS — BP 122/78 | HR 70 | Temp 97.5°F | Resp 16 | Wt 309.8 lb

## 2016-01-25 DIAGNOSIS — M79672 Pain in left foot: Principal | ICD-10-CM

## 2016-01-25 DIAGNOSIS — M1009 Idiopathic gout, multiple sites: Secondary | ICD-10-CM

## 2016-01-25 DIAGNOSIS — L988 Other specified disorders of the skin and subcutaneous tissue: Secondary | ICD-10-CM

## 2016-01-25 DIAGNOSIS — G8929 Other chronic pain: Secondary | ICD-10-CM

## 2016-01-25 DIAGNOSIS — R0902 Hypoxemia: Secondary | ICD-10-CM

## 2016-01-25 DIAGNOSIS — IMO0002 Reserved for concepts with insufficient information to code with codable children: Secondary | ICD-10-CM

## 2016-01-25 DIAGNOSIS — M109 Gout, unspecified: Secondary | ICD-10-CM

## 2016-01-25 NOTE — Progress Notes (Signed)
Subjective:     Patient ID: Michael Reyes, male   DOB: [birth year], 60 y.o.   MRN: [id number]  HPI  Chief Complaint  Patient presents with  . Foot Pain    Patient comes in office today to address foot pain on his left foot, patient reports that pain is on bottom of foot and described as burning sensation. Patient denies any injury or strenuous activities.   . Skin Problem    Patient would like to address what he believes to be a bump under his left pinky finger. Patient states that "bump" has been present over several years but has now become bigger and more painful  States he has had intermittent left lateral foot pain for several months. Tends to flare when he plays disc golf but will be present at times even when he is non-weight bearing. Has been on one Aleve twice daily for a week with minimal improvement. Hx of gout but controlled with allopurinol. Also has chronic cyst appearing structure on his left fifth finger which he wishes further evaluated. Denies repetitive trauma. Also has noticed that when he uses his wife's oximeter to check his oxygen it will be in the 80's. He remains on C-Pap with auto-titration.   Review of Systems     Objective:   Physical Exam  Constitutional: He appears well-developed and well-nourished. No distress.  Cardiovascular:  Pulses:      Dorsalis pedis pulses are 2+ on the left side.       Posterior tibial pulses are 2+ on the left side.  Musculoskeletal:  Tender over lateral left foot at tarsal-metatarsal junction. DF/PF 5/5. Ankle ligaments stable.  Skin:  Dorsum of left fifth finger between his DIP and PIP is 0.5 cm non tender, non-inflamed cyst-like structure.       Assessment:    1. Chronic foot pain, left - DG Foot Complete Left; Future  2. Cyst of finger - Ambulatory referral to Dermatology  3. Hypoxemia - CBC with Differential/Platelet  4. Arthritis due to gout - Uric acid    Plan:    Further f/u pending lab and x-ray results.  Consider podiatry referral. Will increase Aleve to two pills twice daily with food.

## 2016-01-25 NOTE — Patient Instructions (Addendum)
Increase Aleve to two pills twice daily with food.

## 2016-01-26 ENCOUNTER — Other Ambulatory Visit: Payer: Self-pay | Admitting: Family Medicine

## 2016-01-26 ENCOUNTER — Telehealth: Payer: Self-pay

## 2016-01-26 DIAGNOSIS — M79672 Pain in left foot: Principal | ICD-10-CM

## 2016-01-26 DIAGNOSIS — G8929 Other chronic pain: Secondary | ICD-10-CM

## 2016-01-26 LAB — CBC WITH DIFFERENTIAL/PLATELET
BASOS ABS: 0 10*3/uL (ref 0.0–0.2)
Basos: 1 %
EOS (ABSOLUTE): 0.3 10*3/uL (ref 0.0–0.4)
Eos: 6 %
Hematocrit: 46.9 % (ref 37.5–51.0)
Hemoglobin: 16 g/dL (ref 12.6–17.7)
IMMATURE GRANS (ABS): 0 10*3/uL (ref 0.0–0.1)
IMMATURE GRANULOCYTES: 0 %
LYMPHS: 37 %
Lymphocytes Absolute: 2 10*3/uL (ref 0.7–3.1)
MCH: 32.5 pg (ref 26.6–33.0)
MCHC: 34.1 g/dL (ref 31.5–35.7)
MCV: 95 fL (ref 79–97)
Monocytes Absolute: 0.7 10*3/uL (ref 0.1–0.9)
Monocytes: 14 %
NEUTROS PCT: 42 %
Neutrophils Absolute: 2.3 10*3/uL (ref 1.4–7.0)
PLATELETS: 246 10*3/uL (ref 150–379)
RBC: 4.92 x10E6/uL (ref 4.14–5.80)
RDW: 13.1 % (ref 12.3–15.4)
WBC: 5.4 10*3/uL (ref 3.4–10.8)

## 2016-01-26 LAB — URIC ACID: URIC ACID: 5.3 mg/dL (ref 3.7–8.6)

## 2016-01-26 NOTE — Telephone Encounter (Signed)
-----   Message from Carmon Ginsberg, [place] sent at 01/26/2016  7:33 AM EDT ----- No fractures. Arthritic spurs at insertion of your Achilles. Do you wish referral to podiatry?

## 2016-01-26 NOTE — Telephone Encounter (Signed)
-----   Message from Carmon Ginsberg, [place] sent at 01/26/2016  9:48 AM EDT ----- Uric acid level showing good control less than 6 mg./dl

## 2016-01-26 NOTE — Telephone Encounter (Signed)
-----   Message from Carmon Ginsberg, [place] sent at 01/26/2016  7:32 AM EDT ----- Blood count normal, no anemia. May wish to discuss your oximetry reading with your cardiologist.

## 2016-01-26 NOTE — Telephone Encounter (Signed)
Pt advised.   Thanks,   -Laura  

## 2016-01-26 NOTE — Telephone Encounter (Signed)
Pt advised; he would like to be referred to Dr. Paulla Dolly.   Thanks,   -Mickel Baas

## 2016-01-26 NOTE — Telephone Encounter (Signed)
Referral in progress. 

## 2016-01-26 NOTE — Telephone Encounter (Signed)
Pt advised.   Thanks,   -Flordia Kassem  

## 2016-01-30 HISTORY — PX: FINGER SURGERY: SHX640

## 2016-02-03 ENCOUNTER — Ambulatory Visit (INDEPENDENT_AMBULATORY_CARE_PROVIDER_SITE_OTHER): Payer: BLUE CROSS/BLUE SHIELD | Admitting: Podiatry

## 2016-02-03 ENCOUNTER — Encounter: Payer: Self-pay | Admitting: Podiatry

## 2016-02-03 DIAGNOSIS — M779 Enthesopathy, unspecified: Secondary | ICD-10-CM

## 2016-02-03 MED ORDER — MELOXICAM 15 MG PO TABS: 15.0000 mg | ORAL_TABLET | Freq: Every day | ORAL | 2 refills | Status: DC

## 2016-02-03 NOTE — Progress Notes (Signed)
   Subjective:    Patient ID: Michael Reyes, male    DOB: 12-17-1955, 60 y.o.   MRN: [id number]  HPI  60 year old male persists the also concerns of pain to the outside aspect of his left foot which is been ongoing for 3 months. He doesn't history of gout but his blood tests were normal last and that was checked. He had x-rays performed last week. He denies any recent injury or trauma to the majority pain is at the end of day. He also had some pain. He was seen in the car for several hours driving to [place]. He has been taking Aleve twice a day which has helped. Mary does swell to the side of his foot. Denies any redness or warmth. No other complaints at this time. No other treatment.   Review of Systems  All other systems reviewed and are negative.      Objective:   Physical Exam General: AAO x3, NAD   Dermatological: Skin is warm, dry and supple bilateral. Nails x 10 are well manicured; remaining integument appears unremarkable at this time. There are no open sores, no preulcerative lesions, no rash or signs of infection present.  Vascular: Dorsalis Pedis artery and Posterior Tibial artery pedal pulses are 2/4 bilateral with immedate capillary fill time. Pedal hair growth present. There is no pain with calf compression, swelling, warmth, erythema.   Neruologic: Grossly intact via light touch bilateral. Vibratory intact via tuning fork bilateral. Protective threshold with Semmes Wienstein monofilament intact to all pedal sites bilateral.   Musculoskeletal: There's tenderness to palpation to lateral aspect left foot on the fifth metatarsal base just proximal to this area on the insertion of the peroneal tendon. As the visiting without any erythema or increase in warmth. The tendon appears to be intact. There is no pain with range of motion. The pain in vibratory sensation. No other areas of tenderness bilaterally. Mild decrease in medial arch upon weightbearing.  Gait: Unassisted,  Nonantalgic.       Assessment & Plan:  60 year old male left foot insertional peroneal tendinitis left foot -Treatment options discussed including all alternatives, risks, and complications -Etiology of symptoms were discussed -Previous x-rays were reviewed. There does appear to be some spurring, calcification just proximal to the fifth metatarsal base. -Steroid injection was performed today with dexamethasone and local anesthetic without complications. Post injection care was discussed. Plantar fascial brace was applied from medial to lateral. -Prescribed mobic. Discussed side effects of the medication and directed to stop if any are to occur and call the office. Hold aleve or other NSAID. Take only as needed.  -Stretching exercises discussed -Shoe gear modification also discuss orthotics but he just recently purchase an over-the-counter insert we will continue with this. -Follow-up in 4 systems continue or sooner any worsening. Call any questions concerning the meantime.  Celesta Gentile, DPM

## 2016-02-03 NOTE — Patient Instructions (Signed)
Peroneal Tendinitis With Rehab Tendonitis is inflammation of a tendon. Inflammation of the tendons on the back of the outer ankle (peroneal tendons) is known as peroneal tendonitis. The peroneal tendons are responsible for connecting the muscles that allow you to stand on your tiptoes to the bones of the ankle. For this reason, peroneal tendonitis often causes pain when trying to complete such motions. Peroneal tendonitis often involves a tear (strain) of the peroneal tendons. Strains are classified into three categories. Grade 1 strains cause pain, but the tendon is not lengthened. Grade 2 strains include a lengthened ligament, due to the ligament being stretched or partially ruptured. With grade 2 strains there is still function, although function may be decreased. Grade 3 strains involve a complete tear of the tendon or muscle, and function is usually impaired. SYMPTOMS   Pain, tenderness, swelling, warmth, or redness over the back of the outer side of the ankle, the outer part of the mid-foot, or the bottom of the arch.  Pain that gets worse with ankle motion (especially when pushing off or pushing down with the front of the foot), or when standing on the ball of the foot or pushing the foot outward.  Crackling sound (crepitation) when the tendon is moved or touched. CAUSES  Peroneal tendinitis occurs when injury to the peroneal tendons causes the body to respond with inflammation. Common causes of injury include:  An overuse injury, in which the groove behind the outer ankle (where the tendon is located) causes wear on the tendon.  A sudden stress placed on the tendon, such as from an increase in the intensity, frequency, or duration of training.  Direct hit (trauma) to the tendon.  Return to activity too soon after a previous ankle injury. RISK INCREASES WITH:  Sports that require sudden, repetitive pushing off of the foot, such as jumping or quick starts.  Kicking and running sports,  especially running down hills or long distances.  Poor strength and flexibility.  Previous injury to the foot, ankle, or leg. PREVENTION  Warm up and stretch properly before activity.  Allow for adequate recovery between workouts.  Maintain physical fitness:  Strength, flexibility, and endurance.  Cardiovascular fitness.  Complete rehabilitation after previous injury. PROGNOSIS  If treated properly, peroneal tendonitis usually heals within 6 weeks.  RELATED COMPLICATIONS  Longer healing time, if not properly treated or if not given enough time to heal.  Recurring symptoms if activity is resumed too soon, with overuse, or when using poor technique.  If untreated, tendinitis may result in tendon rupture, requiring surgery. TREATMENT  Treatment first involves the use of ice and medicine to reduce pain and inflammation. The use of strengthening and stretching exercises may help reduce pain with activity. These exercises may be performed at unsuccessful, surgery to remove the inflamed tendon lining (sheath) may be advised.  MEDICATION   If pain medicine is needed, nonsteroidal anti-inflammatory medicines (aspirin and ibuprofen), or other minor pain relievers (acetaminophen), are often advised.  Do not take pain medicine for 7 days before surgery.  Prescription pain relievers may be given, if your caregiver thinks they are needed. Use only as directed and only as much as you need. HEAT AND COLD  Cold treatment (icing) should be applied for 10 to 15 minutes every 2 to 3 hours for inflammation and pain, and immediately after activity that aggravates your symptoms. Use ice packs or an ice massage.  Heat treatment may be used before performing stretching and strengthening activities prescribed by your   caregiver, physical therapist, or athletic trainer. Use a heat pack or a warm water soak. SEEK MEDICAL CARE IF:  Symptoms get worse or do not improve in 2 to 4 weeks, despite  treatment.  New, unexplained symptoms develop. (Drugs used in treatment may produce side effects.) EXERCISES RANGE OF MOTION (ROM) AND STRETCHING EXERCISES - Peroneal Tendinitis These exercises may help you when beginning to rehabilitate your injury. Your symptoms may resolve with or without further involvement from your physician, physical therapist or athletic trainer. While completing these exercises, remember:   Restoring tissue flexibility helps normal motion to return to the joints. This allows healthier, less painful movement and activity.  An effective stretch should be held for at least 30 seconds.  A stretch should never be painful. You should only feel a gentle lengthening or release in the stretched tissue. RANGE OF MOTION - Ankle Eversion  Sit with your right / left ankle crossed over your opposite knee.  Grip your foot with your opposite hand, placing your thumb on the top of your foot and your fingers across the bottom of your foot.  Gently push your foot downward with a slight rotation, so your littlest toes rise slightly toward the ceiling.  You should feel a gentle stretch on the inside of your ankle. Hold the stretch for __________ seconds. Repeat __________ times. Complete this exercise __________ times per day.  RANGE OF MOTION - Ankle Inversion  Sit with your right / left ankle crossed over your opposite knee.  Grip your foot with your opposite hand, placing your thumb on the bottom of your foot and your fingers across the top of your foot.  Gently pull your foot so the smallest toe comes toward you and your thumb pushes the inside of the ball of your foot away from you.  You should feel a gentle stretch on the outside of your ankle. Hold the stretch for __________ seconds. Repeat __________ times. Complete this exercise __________ times per day.  RANGE OF MOTION - Ankle Plantar Flexion  Sit with your right / left leg crossed over your opposite knee.  Use  your opposite hand to pull the top of your foot and toes toward you.  You should feel a gentle stretch on the top of your foot and ankle. Hold this position for __________ seconds. Repeat __________ times. Complete __________ times per day.  STRETCH - Gastroc, Standing  Place your hands on a wall.  Extend your right / left leg behind you, keeping the front knee somewhat bent.  Slightly point your toes inward on your back foot.  Keeping your right / left heel on the floor and your knee straight, shift your weight toward the wall, not allowing your back to arch.  You should feel a gentle stretch in the calf. Hold this position for __________ seconds. Repeat __________ times. Complete this stretch __________ times per day. STRETCH - Soleus, Standing  Place your hands on a wall.  Extend your right / left leg behind you, keeping the other knee somewhat bent.  Slightly point your toes inward on your back foot.  Keep your heel on the floor, bend your back knee, and slightly shift your weight over the back leg so that you feel a gentle stretch deep in your back calf.  Hold this position for __________ seconds. Repeat __________ times. Complete this stretch __________ times per day. STRETCH - Gastrocsoleus, Standing Note: This exercise can place a lot of stress on your foot and ankle. Please   complete this exercise only if specifically instructed by your caregiver.   Place the ball of your right / left foot on a step, keeping your other foot firmly on the same step.  Hold on to the wall or a rail for balance.  Slowly lift your other foot, allowing your body weight to press your heel down over the edge of the step.  You should feel a stretch in your right / left calf.  Hold this position for __________ seconds.  Repeat this exercise with a slight bend in your knee. Repeat __________ times. Complete this stretch __________ times per day.  STRENGTHENING EXERCISES - Peroneal Tendinitis   These exercises may help you when beginning to rehabilitate your injury. They may resolve your symptoms with or without further involvement from your physician, physical therapist or athletic trainer. While completing these exercises, remember:   Muscles can gain both the endurance and the strength needed for everyday activities through controlled exercises.  Complete these exercises as instructed by your physician, physical therapist or athletic trainer. Increase the resistance and repetitions only as guided by your caregiver. STRENGTH - Dorsiflexors  Secure a rubber exercise band or tubing to a fixed object (table, pole) and loop the other end around your right / left foot.  Sit on the floor facing the fixed object. The band should be slightly tense when your foot is relaxed.  Slowly draw your foot back toward you, using your ankle and toes.  Hold this position for __________ seconds. Slowly release the tension in the band and return your foot to the starting position. Repeat __________ times. Complete this exercise __________ times per day.  STRENGTH - Towel Curls  Sit in a chair, on a non-carpeted surface.  Place your foot on a towel, keeping your heel on the floor.  Pull the towel toward your heel only by curling your toes. Keep your heel on the floor.  If instructed by your physician, physical therapist or athletic trainer, add weight to the end of the towel. Repeat __________ times. Complete this exercise __________ times per day. STRENGTH - Ankle Eversion   Secure one end of a rubber exercise band or tubing to a fixed object (table, pole). Loop the other end around your foot, just before your toes.  Place your fists between your knees. This will focus your strengthening at your ankle.  Drawing the band across your opposite foot, away from the pole, slowly, pull your little toe out and up. Make sure the band is positioned to resist the entire motion.  Hold this position for  __________ seconds.  Have your muscles resist the band, as it slowly pulls your foot back to the starting position. Repeat __________ times. Complete this exercise __________ times per day.    This information is not intended to replace advice given to you by your health care provider. Make sure you discuss any questions you have with your health care provider.   Document Released: 04/17/2005 Document Revised: 09/01/2014 Document Reviewed: 07/30/2008 [employer] Interactive Patient Education 2016 [employer]  

## 2016-02-08 ENCOUNTER — Ambulatory Visit: Payer: BLUE CROSS/BLUE SHIELD | Admitting: Podiatry

## 2016-02-08 ENCOUNTER — Encounter: Payer: BLUE CROSS/BLUE SHIELD | Admitting: Family Medicine

## 2016-02-14 DIAGNOSIS — R21 Rash and other nonspecific skin eruption: Secondary | ICD-10-CM | POA: Diagnosis not present

## 2016-02-14 DIAGNOSIS — L03012 Cellulitis of left finger: Secondary | ICD-10-CM | POA: Diagnosis not present

## 2016-02-14 DIAGNOSIS — M67442 Ganglion, left hand: Secondary | ICD-10-CM | POA: Diagnosis not present

## 2016-02-23 ENCOUNTER — Encounter: Payer: BLUE CROSS/BLUE SHIELD | Admitting: Family Medicine

## 2016-02-24 ENCOUNTER — Encounter: Payer: Self-pay | Admitting: Family Medicine

## 2016-02-24 ENCOUNTER — Ambulatory Visit (INDEPENDENT_AMBULATORY_CARE_PROVIDER_SITE_OTHER): Payer: BLUE CROSS/BLUE SHIELD | Admitting: Family Medicine

## 2016-02-24 VITALS — BP 122/80 | HR 85 | Temp 97.9°F | Resp 16 | Ht 75.0 in | Wt 309.0 lb

## 2016-02-24 DIAGNOSIS — Z125 Encounter for screening for malignant neoplasm of prostate: Secondary | ICD-10-CM

## 2016-02-24 DIAGNOSIS — R200 Anesthesia of skin: Secondary | ICD-10-CM

## 2016-02-24 DIAGNOSIS — G8929 Other chronic pain: Secondary | ICD-10-CM

## 2016-02-24 DIAGNOSIS — Z0000 Encounter for general adult medical examination without abnormal findings: Secondary | ICD-10-CM

## 2016-02-24 DIAGNOSIS — I2510 Atherosclerotic heart disease of native coronary artery without angina pectoris: Secondary | ICD-10-CM | POA: Diagnosis not present

## 2016-02-24 DIAGNOSIS — R739 Hyperglycemia, unspecified: Secondary | ICD-10-CM

## 2016-02-24 DIAGNOSIS — M25561 Pain in right knee: Secondary | ICD-10-CM

## 2016-02-24 DIAGNOSIS — Z86010 Personal history of colonic polyps: Secondary | ICD-10-CM

## 2016-02-24 DIAGNOSIS — E039 Hypothyroidism, unspecified: Secondary | ICD-10-CM | POA: Diagnosis not present

## 2016-02-24 DIAGNOSIS — Z6838 Body mass index (BMI) 38.0-38.9, adult: Secondary | ICD-10-CM | POA: Diagnosis not present

## 2016-02-24 DIAGNOSIS — E669 Obesity, unspecified: Secondary | ICD-10-CM | POA: Diagnosis not present

## 2016-02-24 DIAGNOSIS — G4733 Obstructive sleep apnea (adult) (pediatric): Secondary | ICD-10-CM | POA: Diagnosis not present

## 2016-02-24 DIAGNOSIS — Z1159 Encounter for screening for other viral diseases: Secondary | ICD-10-CM

## 2016-02-24 DIAGNOSIS — E038 Other specified hypothyroidism: Secondary | ICD-10-CM

## 2016-02-24 NOTE — Progress Notes (Signed)
Patient: Michael Reyes, Male    DOB: 04/12/1956, 60 y.o.   MRN: [id number] Visit Date: 02/24/2016  Today's Provider: Lelon Huh, MD   Chief Complaint  Patient presents with  . Annual Exam  . Gout    follow up  . Hyperlipidemia    follow up   Subjective:   This is a previous patient of Dr. Venia Minks present today as new patient to me to establish care and follow up on chronic medical problems.    Annual physical exam Michael Reyes is a 60 y.o. male who presents today for health maintenance and complete physical. He feels fairly well. He reports exercising 2 times a week. He reports he is sleeping fairly well.  ----------------------------------------------------------------- Follow up Gout:  Patient had labs done 1 month ago which showed normal uric acid levels.  Patient reports good compliance with treatment, good tolerance and good symptom control. Has not had significant gout flare for several years.    Lipid/Cholesterol, Follow-up:   Last seen for this 6 months ago.  Management changes since that visit include none. . Last Lipid Panel:    Component Value Date/Time   CHOL 149 08/05/2015 0953   TRIG 162 (H) 08/05/2015 0953   HDL 39 (L) 08/05/2015 0953   CHOLHDL 3.8 08/05/2015 0953   LDLCALC 78 08/05/2015 0953    Risk factors for vascular disease include hypercholesterolemia  He reports good compliance with treatment. He is not having side effects.  Current symptoms include none and have been stable. Weight trend: stable Prior visit with dietician: no Current diet: in general, a "healthy" diet   Current exercise: team sports (disc golf)  Wt Readings from Last 3 Encounters:  01/25/16 (!) 309 lb 12.8 oz (140.5 kg)  11/22/15 (!) 301 lb (136.5 kg)  10/18/15 (!) 309 lb (140.2 kg)    ------------------------------------------------------------------- He has extensive list of minor complains including persistent pain in medial right knee since having  meniscectomy a few years ago, Occasional episodes of perineal irritation, bump in his left dorsal PIP since having synovial aspiration a few weeks ago, trouble remembering names and where he left his keys, and numbness in three toes of left foot for a couple of months  He has sleep apnea using autoCPAP which he states he uses every single night with great results.   Review of Systems  Constitutional: Negative for appetite change, chills, fatigue and fever.  HENT: Negative for congestion, ear pain, hearing loss, nosebleeds and trouble swallowing.   Eyes: Negative for pain and visual disturbance.  Respiratory: Positive for apnea. Negative for cough, chest tightness and shortness of breath.   Cardiovascular: Positive for leg swelling. Negative for chest pain and palpitations.  Gastrointestinal: Positive for anal bleeding. Negative for abdominal pain, blood in stool, constipation, diarrhea, nausea and vomiting.  Endocrine: Negative for polydipsia, polyphagia and polyuria.  Genitourinary: Negative for dysuria and flank pain.  Musculoskeletal: Positive for arthralgias. Negative for back pain, joint swelling, myalgias and neck stiffness.  Skin: Negative for color change, rash and wound.  Neurological: Negative for dizziness, tremors, seizures, speech difficulty, weakness, light-headedness and headaches.  Psychiatric/Behavioral: Negative for behavioral problems, confusion, decreased concentration, dysphoric mood and sleep disturbance. The patient is not nervous/anxious.   All other systems reviewed and are negative.   Social History      He  reports that he has never smoked. He has never used smokeless tobacco. He reports that he drinks alcohol. He reports that he  does not use drugs.       Social History   Social History  . Marital status: Married    Spouse name: N/A  . Number of children: N/A  . Years of education: N/A   Social History Main Topics  . Smoking status: Never Smoker  .  Smokeless tobacco: Never Used  . Alcohol use 0.0 oz/week     Comment: Occasionally  . Drug use: No  . Sexual activity: Not on file   Other Topics Concern  . Not on file   Social History Narrative  . No narrative on file    No past medical history on file.   Patient Active Problem List   Diagnosis Date Noted  . Allergic rhinitis 10/01/2015  . Chronic cough 10/01/2015  . Venous insufficiency of leg 07/27/2015  . Anxiety 04/02/2015  . Arteriosclerosis of coronary artery 04/02/2015  . Dermatitis, eczematoid 04/02/2015  . Blood glucose elevated 04/02/2015  . HLD (hyperlipidemia) 04/02/2015  . BP (high blood pressure) 04/02/2015  . Disorder of mitral valve 04/02/2015  . Subclinical hypothyroidism 04/02/2015  . Heart valve disease 04/02/2015  . Exposure to hepatitis B 02/26/2015  . OSA (obstructive sleep apnea) 03/30/2014  . Arthritis due to gout 05/08/2008  . ED (erectile dysfunction) of organic origin 05/05/2008    Past Surgical History:  Procedure Laterality Date  . COLONOSCOPY  2012  . FINGER SURGERY Left 01/2016   cyst removal   . KNEE SURGERY Right 12/2013  . VASECTOMY      Family History        Family Status  Relation Status  . Mother Deceased  . Father Deceased at age 63's  . Sister Deceased  . Brother Alive        His family history includes Cancer in his sister; Diabetes in his father and sister; Healthy in his brother; Heart disease in his father; Obesity in his sister; Tongue cancer in his mother.    No Known Allergies  Current Meds  Medication Sig  . allopurinol (ZYLOPRIM) 100 MG tablet Take 2 tablets by mouth  once a day as directed  . aspirin 81 MG tablet Take 81 mg by mouth daily.   Marland Kitchen GLUCOSAMINE-CHONDROIT-VIT C-MN PO Take 2 tablets by mouth daily.   . meloxicam (MOBIC) 15 MG tablet Take 1 tablet (15 mg total) by mouth daily.  . MULTIPLE VITAMIN PO Take 1 tablet by mouth daily.   . simvastatin (ZOCOR) 40 MG tablet Take 1 tablet (40 mg total)  by mouth daily.    Patient Care Team: Birdie Sons, MD as PCP - General (Family Medicine)     Objective:   Vitals: BP 122/80 (BP Location: Right Arm, Patient Position: Sitting, Cuff Size: Large)   Pulse 85   Temp 97.9 F (36.6 C) (Oral)   Resp 16   Ht 6\' 3"  (1.905 m)   Wt (!) 309 lb (140.2 kg)   SpO2 95% Comment: room air  BMI 38.62 kg/m    Physical Exam   General Appearance:    Alert, cooperative, no distress, appears stated age, obese  Head:    Normocephalic, without obvious abnormality, atraumatic  Eyes:    PERRL, conjunctiva/corneas clear, EOM's intact, fundi    benign, both eyes       Ears:    Normal TM's and external ear canals, both ears  Nose:   Nares normal, septum midline, mucosa normal, no drainage   or sinus tenderness  Throat:   Lips, mucosa,  and tongue normal; teeth and gums normal  Neck:   Supple, symmetrical, trachea midline, no adenopathy;       thyroid:  No enlargement/tenderness/nodules; no carotid   bruit or JVD  Back:     Symmetric, no curvature, ROM normal, no CVA tenderness  Lungs:     Clear to auscultation bilaterally, respirations unlabored  Chest wall:    No tenderness or deformity  Heart:    Regular rate and rhythm, S1 and S2 normal, no murmur, rub   or gallop  Abdomen:     Soft, non-tender, bowel sounds active all four quadrants,    no masses, no organomegaly  Genitalia:    deferred  Rectal:    deferred  Extremities:   Extremities normal, atraumatic, no cyanosis or edema.Mild tenderness but no swelling or erythema right medial knee join line.   Pulses:   2+ and symmetric all extremities. Cap refill < 3 seconds.   Skin:   Skin color, texture, turgor normal, no rashes or lesions  Lymph nodes:   Cervical, supraclavicular, and axillary nodes normal  Neurologic:   CNII-XII intact. Normal strength, sensation and reflexes      Throughout. Normal s/s    Depression Screen PHQ 2/9 Scores 02/24/2016  PHQ - 2 Score 0  PHQ- 9 Score 0       Current Exercise Habits: Home exercise routine, Type of exercise: Other - see comments (disc golf), Time (Minutes): > 60, Intensity: Moderate Exercise limited by: None identified   Assessment & Plan:     Routine Health Maintenance and Physical Exam  Exercise Activities and Dietary recommendations Goals    None      Immunization History  Administered Date(s) Administered  . Influenza-Unspecified 01/30/2015  . Tdap 11/18/2013    Health Maintenance  Topic Date Due  . Hepatitis C Screening  21-Jul-1955  . HIV Screening  03/26/1969  . COLONOSCOPY  03/26/2004  . ZOSTAVAX  03/26/2014  . INFLUENZA VACCINE  11/30/2015  . TETANUS/TDAP  11/19/2023      Discussed health benefits of physical activity, and encouraged him to engage in regular exercise appropriate for his age and condition.    --------------------------------------------------------------------  1. Annual physical exam   2. Prostate cancer screening Discussed risk/benefits PSA screening.  - PSA  3. OSA (obstructive sleep apnea) Very well controlled on autoCPAP  4. Subclinical hypothyroidism  - T4 AND TSH  5. History of adenomatous polyp of colon Advised he is due for follow up colonoscopy this year. He is going to check with his wife to see who she want to do procedure and will call back.   6. Numbness of toes  - VITAMIN D 25 Hydroxy (Vit-D Deficiency, Fractures) - Vitamin B12  7. Hyperglycemia  - Hemoglobin A1c  8. Need for hepatitis C screening test  - Hepatitis C antibody  9. Chronic pain of right knee Likely some OA. It is tolerable at this point, but advised he needs to stay physically active and lose weight.   10. Class 2 obesity without serious comorbidity with body mass index (BMI) of 38.0 to 38.9 in adult, unspecified obesity type   11. Arteriosclerosis of coronary artery Asymptomatic. Compliant with medication.  Continue aggressive risk factor modification.    Lelon Huh,  MD  [facility]

## 2016-02-24 NOTE — Patient Instructions (Addendum)
You are due for colonoscopy due to history tubular adenoma on colonoscopy in 2012. Please call back to schedule referral to gastroenterologist at your earliest convenience.   It is recommended to engage in 150 minutes of moderate exercise every week.

## 2016-02-28 DIAGNOSIS — G4733 Obstructive sleep apnea (adult) (pediatric): Secondary | ICD-10-CM | POA: Diagnosis not present

## 2016-02-29 ENCOUNTER — Other Ambulatory Visit: Payer: Self-pay | Admitting: Family Medicine

## 2016-02-29 DIAGNOSIS — E039 Hypothyroidism, unspecified: Secondary | ICD-10-CM | POA: Diagnosis not present

## 2016-02-29 DIAGNOSIS — R200 Anesthesia of skin: Secondary | ICD-10-CM | POA: Diagnosis not present

## 2016-02-29 DIAGNOSIS — E7800 Pure hypercholesterolemia, unspecified: Secondary | ICD-10-CM

## 2016-02-29 DIAGNOSIS — Z1159 Encounter for screening for other viral diseases: Secondary | ICD-10-CM | POA: Diagnosis not present

## 2016-02-29 DIAGNOSIS — Z125 Encounter for screening for malignant neoplasm of prostate: Secondary | ICD-10-CM | POA: Diagnosis not present

## 2016-02-29 DIAGNOSIS — R739 Hyperglycemia, unspecified: Secondary | ICD-10-CM | POA: Diagnosis not present

## 2016-02-29 MED ORDER — SIMVASTATIN 40 MG PO TABS: 40.0000 mg | ORAL_TABLET | Freq: Every day | ORAL | 4 refills | Status: DC

## 2016-02-29 MED ORDER — ALLOPURINOL 100 MG PO TABS: ORAL_TABLET | ORAL | 4 refills | Status: DC

## 2016-02-29 NOTE — Telephone Encounter (Signed)
Patient needs refills on his allopurinol (ZYLOPRIM) 100 MG tablet  and also his simvastatin (ZOCOR) 40 MG tablet   needs to be called into [facility] ([place]) [phone]  Call him if have any questions

## 2016-03-01 LAB — VITAMIN D 25 HYDROXY (VIT D DEFICIENCY, FRACTURES): Vit D, 25-Hydroxy: 38.2 ng/mL (ref 30.0–100.0)

## 2016-03-01 LAB — HEMOGLOBIN A1C
ESTIMATED AVERAGE GLUCOSE: 123 mg/dL
HEMOGLOBIN A1C: 5.9 % — AB (ref 4.8–5.6)

## 2016-03-01 LAB — HEPATITIS C ANTIBODY

## 2016-03-01 LAB — PSA: Prostate Specific Ag, Serum: 0.6 ng/mL (ref 0.0–4.0)

## 2016-03-01 LAB — T4 AND TSH
T4, Total: 4.8 ug/dL (ref 4.5–12.0)
TSH: 5.01 u[IU]/mL — AB (ref 0.450–4.500)

## 2016-03-01 LAB — VITAMIN B12: VITAMIN B 12: 375 pg/mL (ref 211–946)

## 2016-03-02 ENCOUNTER — Ambulatory Visit: Payer: BLUE CROSS/BLUE SHIELD | Admitting: Podiatry

## 2016-03-28 ENCOUNTER — Ambulatory Visit (INDEPENDENT_AMBULATORY_CARE_PROVIDER_SITE_OTHER): Payer: BLUE CROSS/BLUE SHIELD | Admitting: Family Medicine

## 2016-03-28 DIAGNOSIS — Z23 Encounter for immunization: Secondary | ICD-10-CM | POA: Diagnosis not present

## 2016-03-28 NOTE — Progress Notes (Signed)
Vaccine only. No MD visit today.

## 2016-04-17 DIAGNOSIS — E782 Mixed hyperlipidemia: Secondary | ICD-10-CM | POA: Diagnosis not present

## 2016-04-17 DIAGNOSIS — I2510 Atherosclerotic heart disease of native coronary artery without angina pectoris: Secondary | ICD-10-CM | POA: Diagnosis not present

## 2016-04-17 DIAGNOSIS — E669 Obesity, unspecified: Secondary | ICD-10-CM | POA: Diagnosis not present

## 2016-04-17 DIAGNOSIS — G4733 Obstructive sleep apnea (adult) (pediatric): Secondary | ICD-10-CM | POA: Diagnosis not present

## 2016-04-28 ENCOUNTER — Ambulatory Visit: Payer: BLUE CROSS/BLUE SHIELD | Admitting: Family Medicine

## 2016-05-16 ENCOUNTER — Ambulatory Visit (INDEPENDENT_AMBULATORY_CARE_PROVIDER_SITE_OTHER): Payer: BLUE CROSS/BLUE SHIELD | Admitting: Family Medicine

## 2016-05-16 DIAGNOSIS — Z23 Encounter for immunization: Secondary | ICD-10-CM | POA: Diagnosis not present

## 2016-05-19 NOTE — Progress Notes (Signed)
Vaccine only

## 2016-06-07 IMAGING — CR DG TOE 2ND*L*
1 series · 3 of 3 positions shown · non-contrast
Comparison: No priors.

CLINICAL DATA: History of trauma to the left second toe complaining
of pain.

EXAM:
LEFT SECOND TOE

[Series 1: kdxr toe 2nd digit left foot · 0.14mm/px · 3 of 3 slices shown]
[im 1/3]
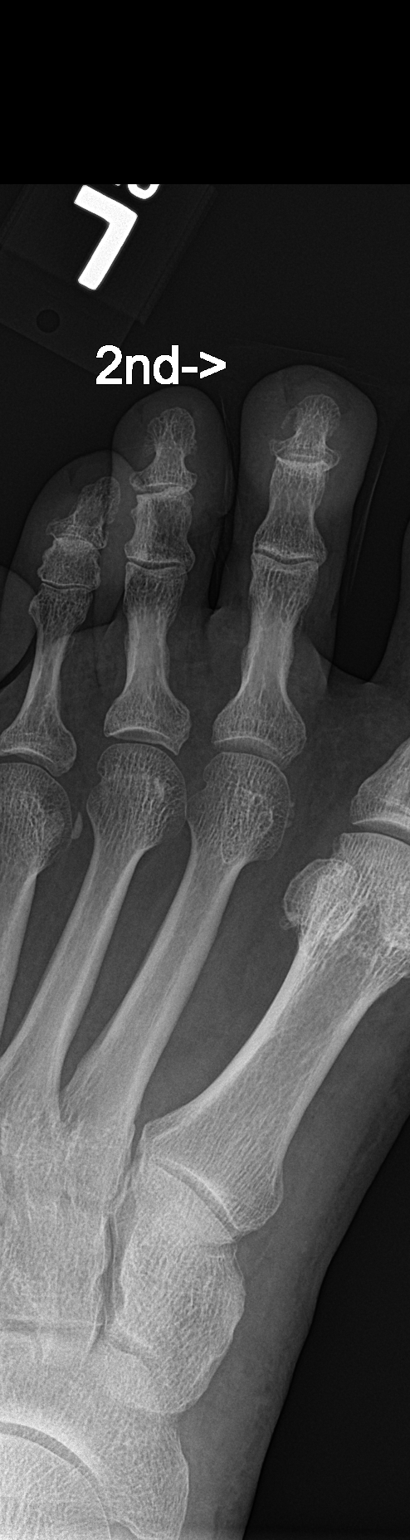
[im 2/3]
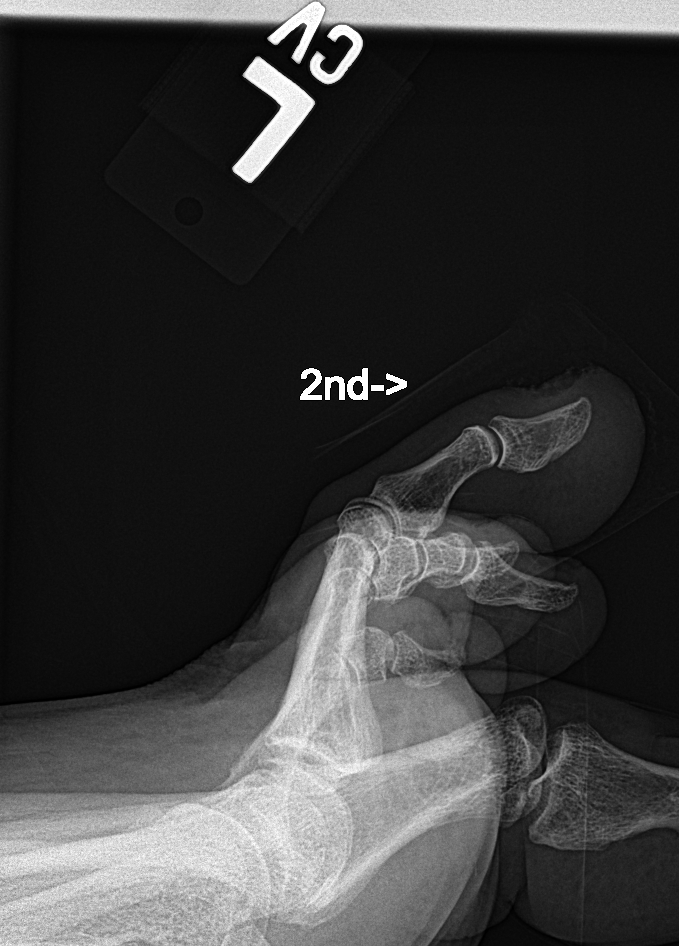
[im 3/3]
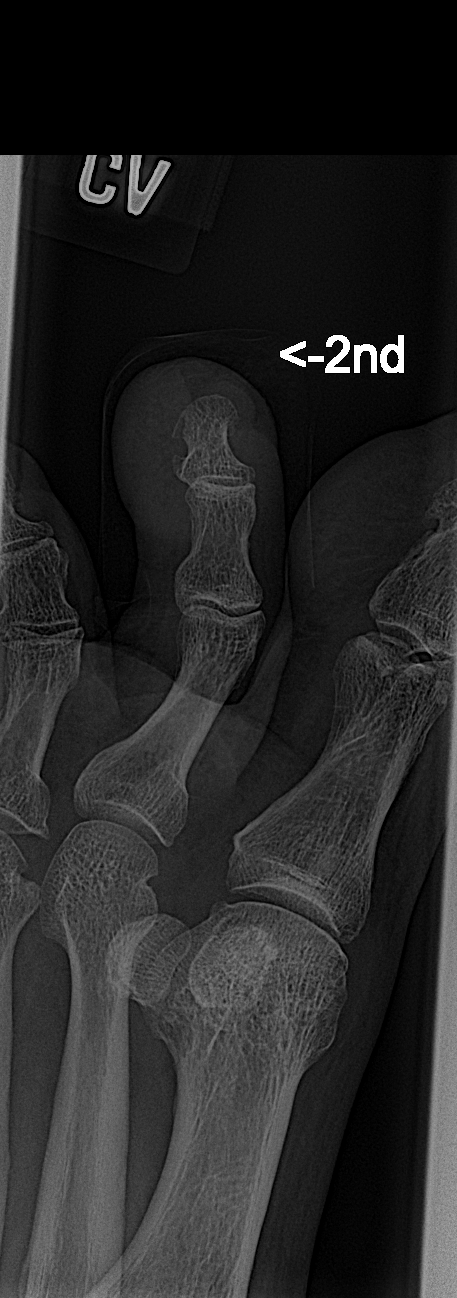

[3 of 3 positions shown; findings below may reference images not displayed]

FINDINGS: Three views of the left toe demonstrates a small fragment off the
lateral aspect of the tuft of the distal phalanx. No other acute
displaced fracture, subluxation or dislocation is noted. Profound
soft tissue swelling the distal aspect of the second toe is noted.
IMPRESSION: 1. Small fracture of the lateral aspect of the tuft of the second
distal phalanx.

## 2016-08-09 IMAGING — MR MR KNEE*R* W/O CM
4 of 6 series · 15 of 40 positions shown · non-contrast
Comparison: None.

CLINICAL DATA: Right knee pain medially for 4 weeks

EXAM:
MRI OF THE RIGHT KNEE WITHOUT CONTRAST
TECHNIQUE: Multiplanar, multisequence MR imaging of the knee was performed. No
intravenous contrast was administered.

[Series 4: pd_tse_fs_cor · coronal · 3.2mm · 0.25mm/px · 3 of 26 slices shown]
[im 5/26]
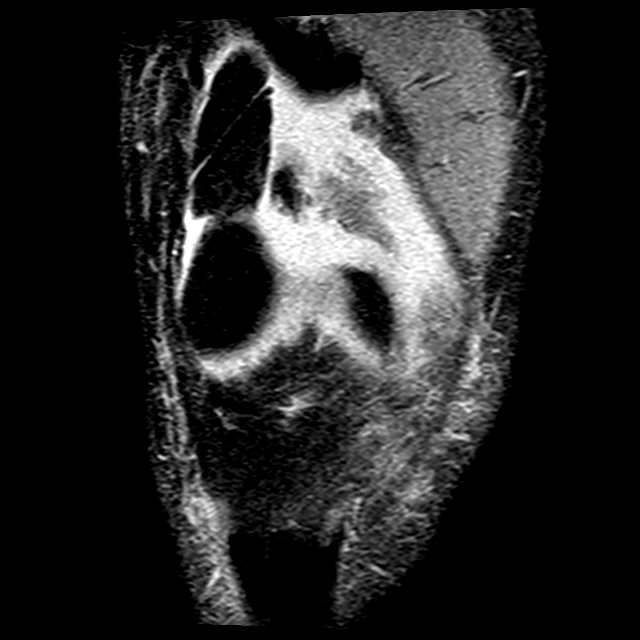
[im 13/26]
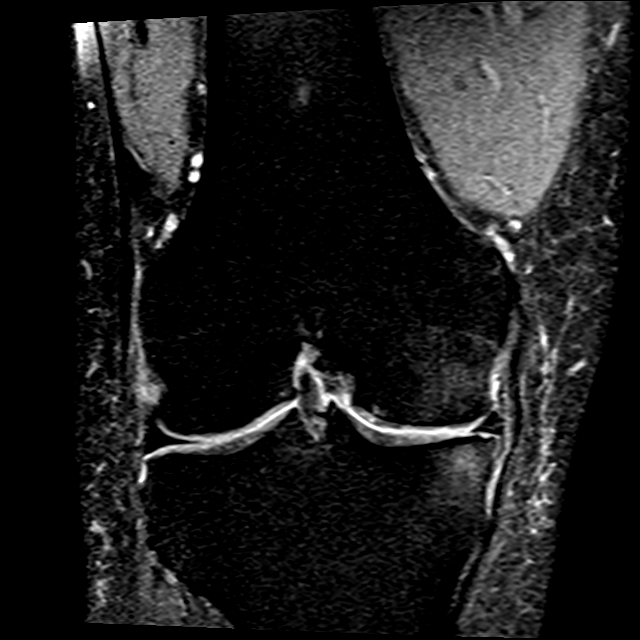
[im 21/26]
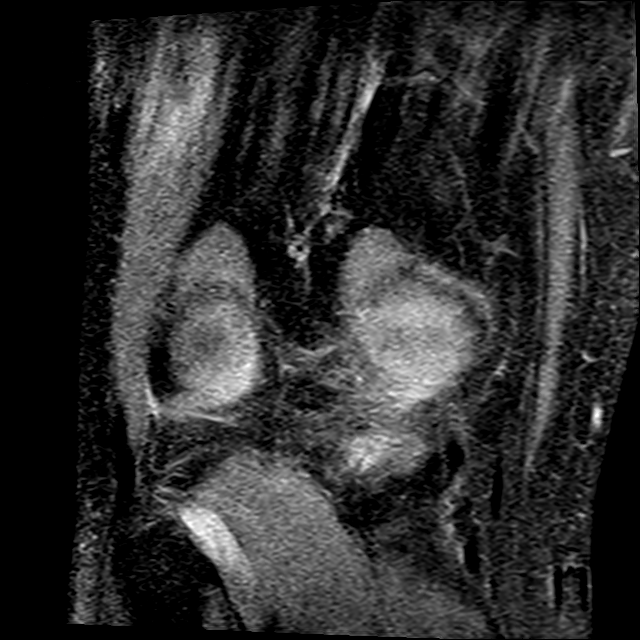

[Series 5: T2 fat-sat · coronal · 3.2mm · 0.62mm/px · 3 of 26 slices shown]
[im 5/26]
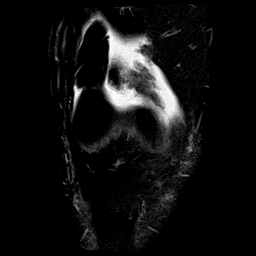
[im 13/26]
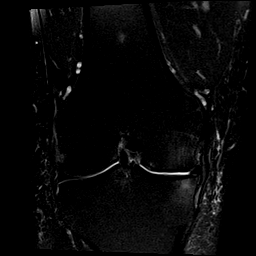
[im 21/26]
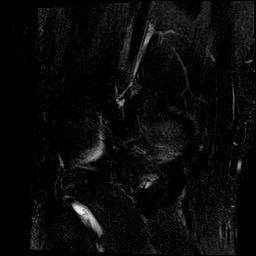

[Series 6: T1 · coronal · 3.2mm · 0.25mm/px · 3 of 26 slices shown]
[im 5/26]
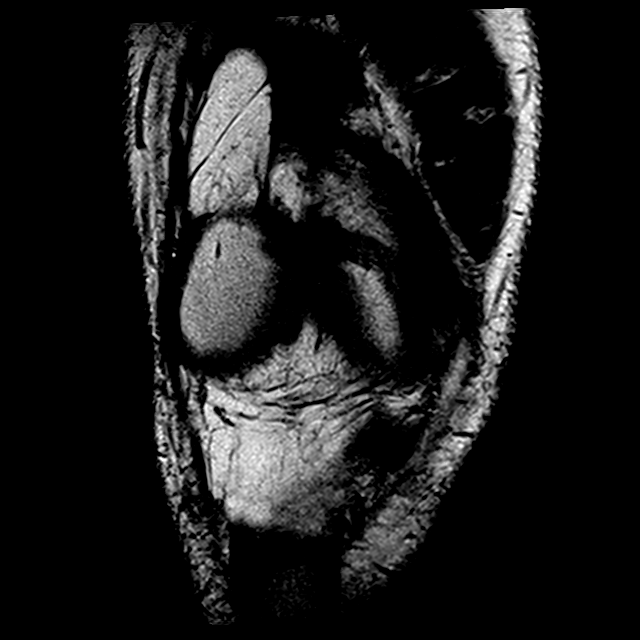
[im 13/26]
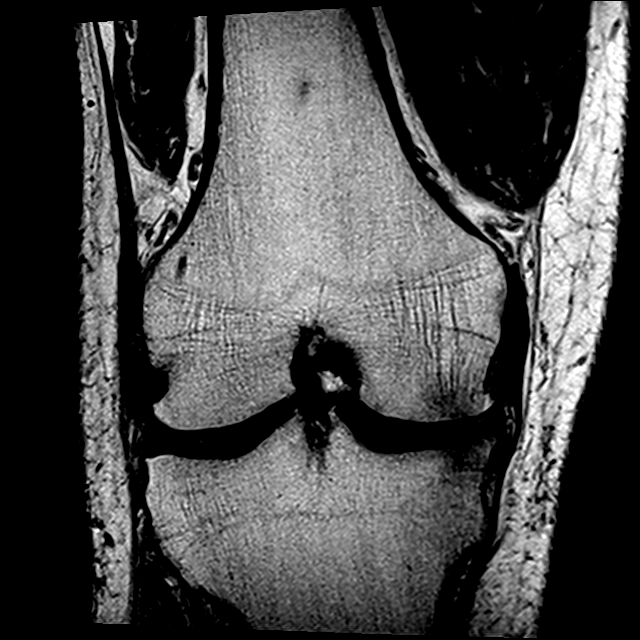
[im 21/26]
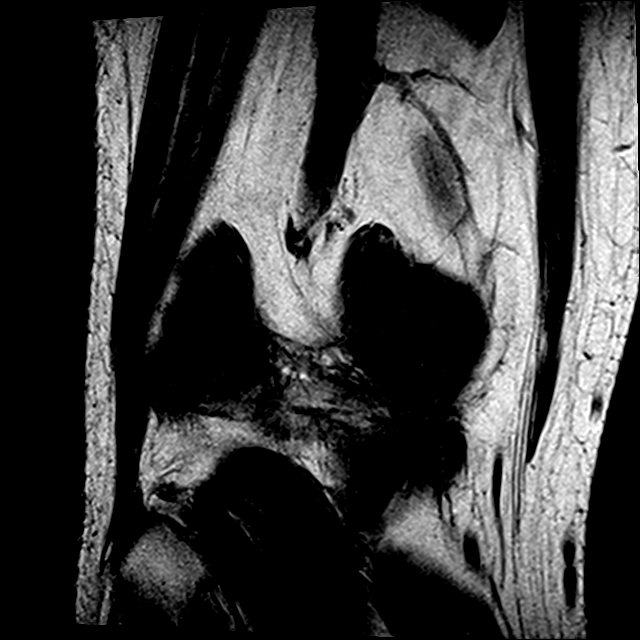

[Series 7: PD fat-sat · sagittal · 3.5mm · 0.27mm/px · 6 of 26 slices shown]
[im 1/26]
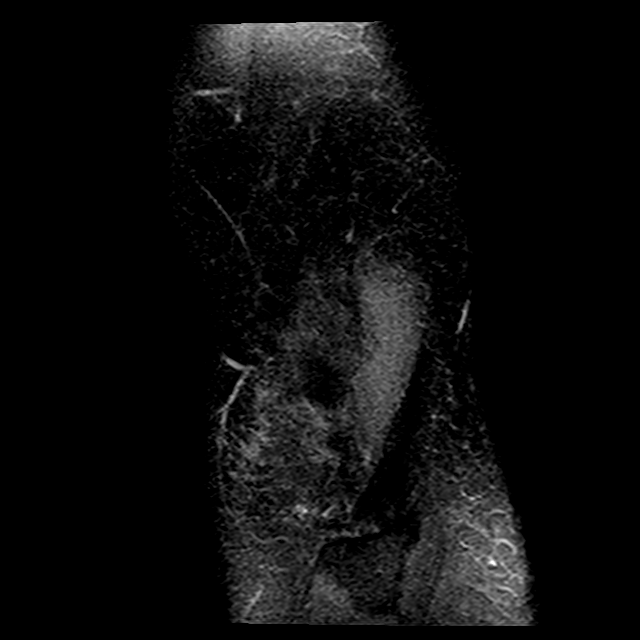
[im 5/26]
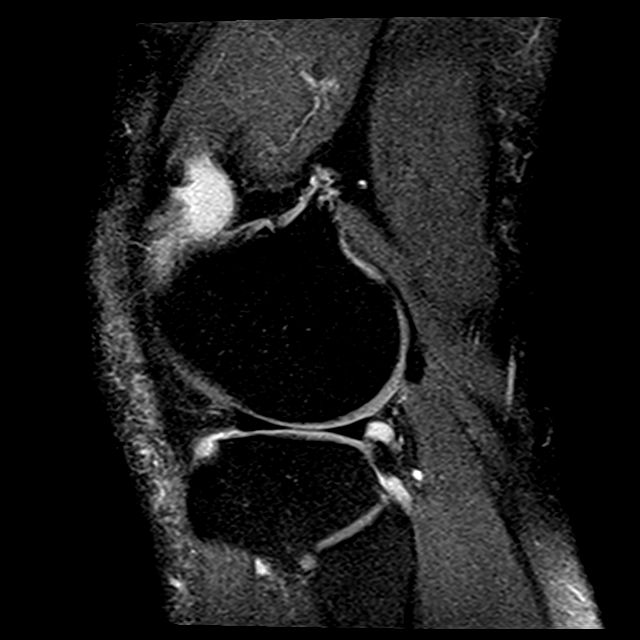
[im 9/26]
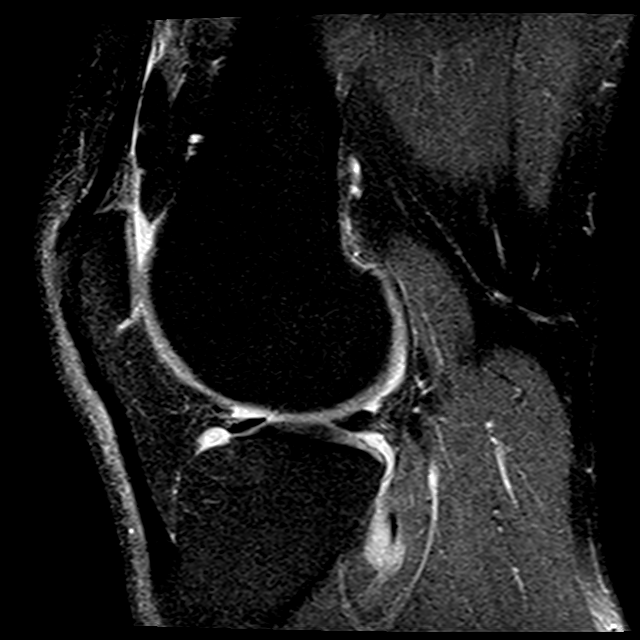
[im 13/26]
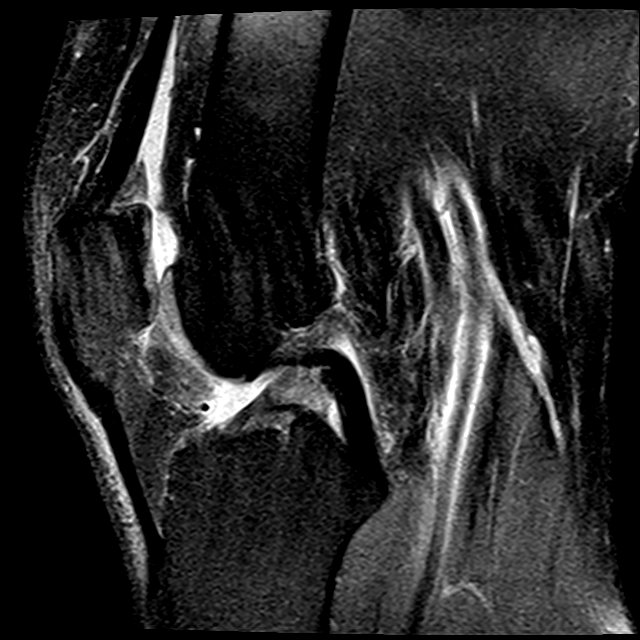
[im 17/26]
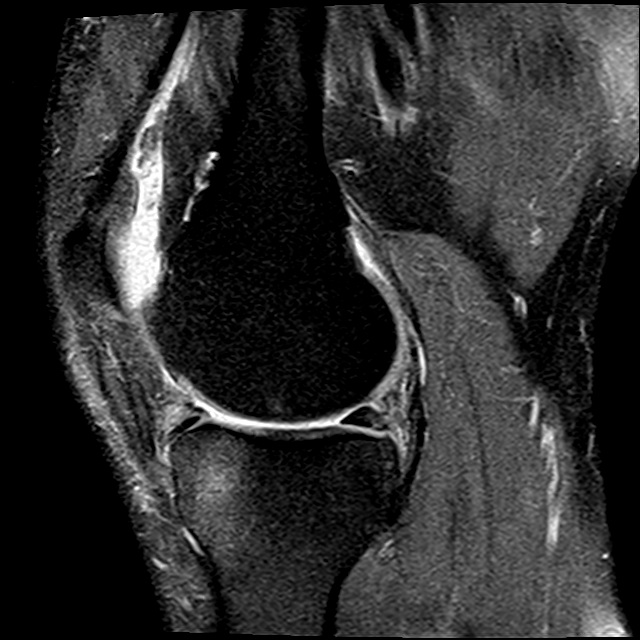
[im 21/26]
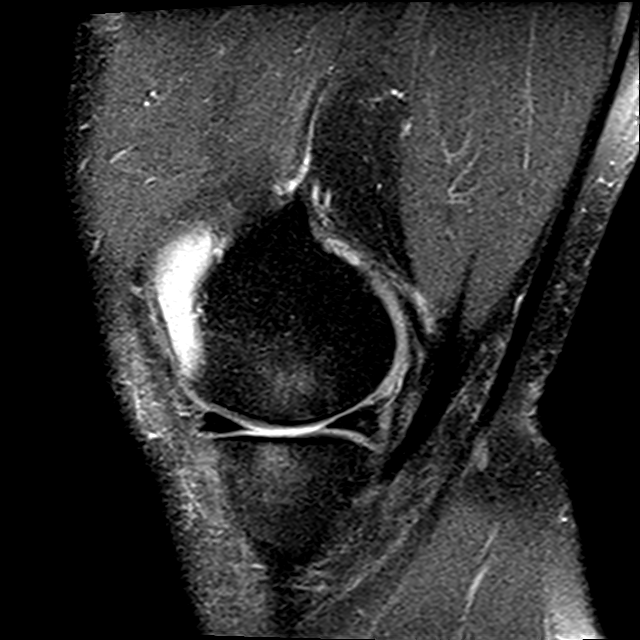

[15 of 40 positions shown; findings below may reference images not displayed]

FINDINGS: MENISCI

Medial meniscus: Radial tear of the posterior horn of medial
meniscus adjacent to the meniscal root.

Lateral meniscus:  Intact.

LIGAMENTS

Cruciates:  Intact ACL and PCL.

Collaterals: Medial collateral ligament is intact. Lateral
collateral ligament complex is intact.

CARTILAGE

Patellofemoral:  No focal chondral defect.

Medial: Partial thickness cartilage loss of the medial femoral
condyle and medial tibial plateau with subchondral reactive marrow
changes.

Lateral:  No focal chondral defect.  Mild cartilage irregularity.

Joint: Small joint effusion. Normal Hoffa's fat. No plical
thickening.

Popliteal Fossa:  No Baker cyst.  Intact popliteus tendon.

Extensor Mechanism:  Intact.

Bones: No other marrow signal abnormality. No fracture or
dislocation.
IMPRESSION: 1. Radial tear of the posterior horn of the medial meniscus adjacent
to the meniscal root.
2. Partial thickness cartilage loss of the medial femoral condyle
and medial tibial plateau with subchondral reactive marrow changes.

## 2016-09-27 ENCOUNTER — Ambulatory Visit: Payer: BLUE CROSS/BLUE SHIELD | Admitting: Family Medicine

## 2016-09-27 ENCOUNTER — Ambulatory Visit (INDEPENDENT_AMBULATORY_CARE_PROVIDER_SITE_OTHER): Payer: BLUE CROSS/BLUE SHIELD | Admitting: Physician Assistant

## 2016-09-27 DIAGNOSIS — Z23 Encounter for immunization: Secondary | ICD-10-CM | POA: Diagnosis not present

## 2016-10-01 NOTE — Progress Notes (Signed)
Patient was given Hep B injection without complication.

## 2016-12-08 ENCOUNTER — Ambulatory Visit (INDEPENDENT_AMBULATORY_CARE_PROVIDER_SITE_OTHER): Payer: BLUE CROSS/BLUE SHIELD | Admitting: Family Medicine

## 2016-12-08 ENCOUNTER — Encounter: Payer: Self-pay | Admitting: Family Medicine

## 2016-12-08 VITALS — BP 120/80 | HR 84 | Temp 97.4°F | Resp 16 | Wt 304.0 lb

## 2016-12-08 DIAGNOSIS — E039 Hypothyroidism, unspecified: Secondary | ICD-10-CM

## 2016-12-08 DIAGNOSIS — R7303 Prediabetes: Secondary | ICD-10-CM

## 2016-12-08 DIAGNOSIS — R200 Anesthesia of skin: Secondary | ICD-10-CM | POA: Diagnosis not present

## 2016-12-08 DIAGNOSIS — M67442 Ganglion, left hand: Secondary | ICD-10-CM | POA: Diagnosis not present

## 2016-12-08 DIAGNOSIS — E038 Other specified hypothyroidism: Secondary | ICD-10-CM

## 2016-12-08 DIAGNOSIS — N3943 Post-void dribbling: Secondary | ICD-10-CM

## 2016-12-08 NOTE — Assessment & Plan Note (Addendum)
Superficial in the distribution of the lateral femoral cutaneous nerve No other neuro deficits to suggest more central pathology No known injury or surgery to the area Watchful waiting Return precautions discussed Check B12, CMP, A1c, TSH

## 2016-12-08 NOTE — Progress Notes (Signed)
Patient: Michael Reyes Male    DOB: [birth year]   61 y.o.   MRN: [id number] Visit Date: 12/08/2016  Today's Provider: Lavon Paganini, MD   Chief Complaint  Patient presents with  . Numbness   Subjective:    HPI   Numbness Mr. Hatchel is presenting to office c/o numbness of his left lateral knee. The numbness has been present for 5 weeks. He denies pain, but the numbness is worsening. He had surgery of his right knee in 2015 or 2016 per pt. The sensation is different than his right knee problem. He is concerned that the numbness could be symptomatic of a neurological disorder. No weakness, gait abnormality, back pain, injury, saddle anesthesia, incontinence, h/o cancer, fever.  He is also concerned of toenail problems. His second toenail on bilateral feet are blackening and falling off.  This occurs when wearing sneakers and playing disc golf. He is also concerned that this could be secondary to bigger problem. He has also noticed urinary dribbling, which is a new problem. He has a H/O kidney stones, and about once a month he notices a quick pain of his flank (unknown laterality). He reports he is drinking more lemonade, and is asking if this could improve or worsen the flank pain.  Also having dribbling after urinating x70m, intermittently, less than half the time.  IPSS score 1.  Noted bump on dorsum of L 3rd finger DIP x4-8 weeks, previously had several of these on hands and ankle.  Slight annoyance but not painful.  No Known Allergies   Current Outpatient Prescriptions:  .  allopurinol (ZYLOPRIM) 100 MG tablet, Take 2 tablets by mouth  once a day as directed, Disp: 180 tablet, Rfl: 4 .  aspirin 81 MG tablet, Take 81 mg by mouth daily. , Disp: , Rfl:  .  GLUCOSAMINE-CHONDROIT-VIT C-MN PO, Take 2 tablets by mouth daily. , Disp: , Rfl:  .  MULTIPLE VITAMIN PO, Take 1 tablet by mouth daily. , Disp: , Rfl:  .  simvastatin (ZOCOR) 40 MG tablet, Take 1 tablet (40 mg total) by mouth  daily., Disp: 90 tablet, Rfl: 4  Review of Systems  Constitutional: Negative for activity change, appetite change, chills, diaphoresis, fatigue, fever and unexpected weight change.  HENT: Negative.   Respiratory: Negative.   Cardiovascular: Negative.   Gastrointestinal: Negative.   Genitourinary: Negative for difficulty urinating, dysuria, flank pain, frequency, hematuria and urgency.  Musculoskeletal: Negative for arthralgias, gait problem and joint swelling.  Skin: Negative.   Neurological: Positive for numbness. Negative for dizziness, syncope, weakness, light-headedness and headaches.  Psychiatric/Behavioral: Negative.     Social History  Substance Use Topics  . Smoking status: Never Smoker  . Smokeless tobacco: Never Used  . Alcohol use 0.0 oz/week     Comment: Occasionally   Objective:   BP 120/80 (BP Location: Left Arm, Patient Position: Sitting, Cuff Size: Large)   Pulse 84   Temp (!) 97.4 F (36.3 C) (Oral)   Resp 16   Wt (!) 304 lb (137.9 kg)   BMI 38.00 kg/m  Vitals:   12/08/16 1103  BP: 120/80  Pulse: 84  Resp: 16  Temp: (!) 97.4 F (36.3 C)  TempSrc: Oral  Weight: (!) 304 lb (137.9 kg)     Physical Exam  Constitutional: He is oriented to person, place, and time. He appears well-developed and well-nourished. No distress.  HENT:  Head: Normocephalic and atraumatic.  Right Ear: External ear normal.  Left Ear: External  ear normal.  Mouth/Throat: Oropharynx is clear and moist.  Eyes: Conjunctivae are normal.  Neck: Neck supple.  Cardiovascular: Normal rate and regular rhythm.   Pulmonary/Chest: Effort normal. No respiratory distress.  Musculoskeletal:  L 3rd finger dorsum of DIP: +0.5cm ganglion cyst, ROM intact, strength intact  Back: No TTP over spinous processes or paraspinal musculature. Strength intact in hip flexion/abduction/adduction, knee flexion/extension, dorsiflexion and plantarflexion. Sensation intact to light touch in LEs with exception  of mildly decreased along lateral L thigh from mid thigh to knee  B/l 2nd toenails absent  Neurological: He is alert and oriented to person, place, and time. No cranial nerve deficit.  Skin: Skin is warm and dry. No rash noted.  Psychiatric: He has a normal mood and affect. His behavior is normal.  Vitals reviewed.      Assessment & Plan:         Dribbling following urination IPSS score of 1 Likely very mild BPH Return precautions discussed  Ganglion cyst of finger of left hand Reassured Watchful waiting at this time Could consider Ortho/Hand referral for removal/injection in the future  Prediabetes Recheck A1c  Thigh numbness Superficial in the distribution of the lateral femoral cutaneous nerve No other neuro deficits to suggest more central pathology No known injury or surgery to the area Watchful waiting Return precautions discussed Check B12, CMP, A1c, TSH  Subclinical hypothyroidism Recheck TSH    Lavon Paganini, MD  [facility]

## 2016-12-08 NOTE — Assessment & Plan Note (Signed)
Recheck A1c 

## 2016-12-08 NOTE — Assessment & Plan Note (Signed)
Reassured Watchful waiting at this time Could consider Ortho/Hand referral for removal/injection in the future

## 2016-12-08 NOTE — Assessment & Plan Note (Signed)
IPSS score of 1 Likely very mild BPH Return precautions discussed

## 2016-12-08 NOTE — Patient Instructions (Signed)
Nice to meet you today.  We will check some labs to evaluate the numbness.  If you have continued progression or weakness, let me know.    You have a ganglion cyst of your finger.  Nothing to do about it at this time.  Orthopedics could inject it or remove it in the future if it bothers you.  Let me know if you have any worsening urinary symptoms.   Benign Prostatic Hyperplasia Benign prostatic hyperplasia is when the prostate gland is bigger than normal (enlarged). The prostate is a gland that produces the fluid that goes into semen. It is near the opening to the bladder and it surrounds the tube that drains urine out of the body (urethra). Benign prostatic hyperplasia is common among older men and it typically causes problems with urinating. The prostate grows slowly as you age. As the prostate grows, it can pinch the urethra. This causes the bladder to work too hard to pass urine, which leads to a thickened bladder wall. The bladder may eventually become weak and unable to empty completely. What are the causes? The exact cause of this condition is not known. It may be related to changes in hormones as the body ages. What increases the risk? You are more likely to develop this condition if:  You have a family history of the condition.  You are age 48 or older.  You have a history of erectile dysfunction.  You do not exercise.  You have certain medical conditions, including: ? Type 2 diabetes. ? Obesity. ? Heart and circulatory disease.  What are the signs or symptoms? Symptoms of this condition include:  Weak or interrupted urine stream.  Dribbling or leaking urine.  Feeling like the bladder has not emptied completely.  Difficulty starting urination.  Getting up frequently at night to urinate.  Urinating more often (8 or more times a day).  Accidental loss of urine (urinary incontinence).  Pain during urination or ejaculation.  Urine with an unusual smell or  color.  The size of the prostate does not always determine the severity of the symptoms. For example, a man with a large prostate may experience minor symptoms, or a man with a smaller prostate may experience a severe blockage. How is this diagnosed? This condition may be diagnosed based on:  Your medical history and symptoms.  A physical exam. This usually includes a digital rectal exam. During this exam, your health care provider places a gloved, lubricated finger into the rectum to feel the size of the prostate.  A blood test. This test checks for high levels of a protein that is produced by the prostate (prostate specific antigen, PSA).  Tests to examine how well the urethra and bladder are functioning (urodynamic tests).  Cystoscopy. For this test, a small, tube-shaped instrument (cystoscope) is used to look inside the urethra and bladder. The cystoscope is placed into the urinary tract through the opening at the tip of the penis.  Urine tests.  Ultrasound.  How is this treated? Treatment for this condition depends on how severe your symptoms are. Treatment may include:  Active surveillance or "watchful waiting." If your symptoms are mild, your health care provider may delay treatment and ask you to keep track of your symptoms. You will have regular checkups to examine the size of your prostate, discuss symptoms, and determine whether treatment is needed.  Medicines. These may be used to: ? Stop prostate growth. ? Shrink the prostate. ? Relieve symptoms.  Lifestyle changes, including: ?  Pelvic floor muscle exercises. The pelvic floor muscles are a group of muscles that relax when you urinate. ? Bladder training. This involves exercises that train the bladder to hold more urine for longer periods. ? Reducing the amount of liquid that you drink. This is especially important before sleeping and before long periods of time spent in public. ? Reducing the amount of caffeine and  alcohol that you drink. ? Treating or preventing constipation.  Surgery to reduce the size of the prostate or widen the urethra. This is typically done if your symptoms are severe or there are serious complications from the enlarged prostate.  Follow these instructions at home: Medicines  Take over-the-counter and prescription medicines as told by your health care provider.  Avoid certain medicines, such as decongestants, antihistamines, and some prescription medicines as told by your health care provider. Ask your health care provider which medicines you should avoid. General instructions  Monitor your symptoms for any changes. Tell your health care provider about any changes.  Give yourself time when you urinate.  Avoid certain beverages that can irritate the bladder, such as: ? Alcohol. ? Caffeinated drinks like coffee, tea, and cola.  Avoid drinking large amounts of liquid before bed or before going out in public.  Do pelvic floor muscle or bladder training exercises as told by your health care provider.  Keep all follow-up visits as told by your health care provider. This is important. Contact a health care provider if:  Your develop new or worse symptoms.  You have trouble getting or maintaining an erection.  You have a fever.  You have pain or burning during urination.  You have blood in your urine. Get help right away if:  You have severe pain when urinating.  You cannot urinate.  You have severe pain in your abdomen.  You are dizzy.  You faint.  You have severe back pain.  Your urine is dark red and difficult to see through.  You have large blood clots in your urine.  You have severe pain after an erection.  You have chest pain, dizziness, or nausea during sexual activity. Summary  The prostate is a gland that produces the fluid that goes into semen. It is near the opening to the bladder and it surrounds the tube that drains urine out of the body  (urethra).  Benign prostatic hyperplasia is common among older men and it typically causes problems with urinating.  If your symptoms are mild, your health care provider may delay treatment and ask you to keep track of your symptoms. You will have regular checkups to examine the size of your prostate, discuss symptoms, and determine whether treatment is needed.  If directed, you may need to avoid certain medicines, such as decongestants, antihistamines, and some prescription medicines.  Contact your health care provider if you develop new or worse symptoms. This information is not intended to replace advice given to you by your health care provider. Make sure you discuss any questions you have with your health care provider. Document Released: 04/17/2005 Document Revised: 03/06/2016 Document Reviewed: 03/06/2016 Elsevier Interactive Patient Education  2017 [employer].

## 2016-12-08 NOTE — Assessment & Plan Note (Signed)
Recheck TSH 

## 2016-12-09 LAB — COMPREHENSIVE METABOLIC PANEL
A/G RATIO: 1.7 (ref 1.2–2.2)
ALK PHOS: 104 IU/L (ref 39–117)
ALT: 39 IU/L (ref 0–44)
AST: 29 IU/L (ref 0–40)
Albumin: 4.4 g/dL (ref 3.6–4.8)
BILIRUBIN TOTAL: 0.4 mg/dL (ref 0.0–1.2)
BUN / CREAT RATIO: 15 (ref 10–24)
BUN: 15 mg/dL (ref 8–27)
CO2: 25 mmol/L (ref 20–29)
Calcium: 9.6 mg/dL (ref 8.6–10.2)
Chloride: 104 mmol/L (ref 96–106)
Creatinine, Ser: 1.02 mg/dL (ref 0.76–1.27)
GFR calc Af Amer: 91 mL/min/{1.73_m2} (ref >59)
GFR calc non Af Amer: 78 mL/min/{1.73_m2} (ref >59)
GLOBULIN, TOTAL: 2.6 g/dL (ref 1.5–4.5)
Glucose: 123 mg/dL — ABNORMAL HIGH (ref 65–99)
Potassium: 4.2 mmol/L (ref 3.5–5.2)
SODIUM: 142 mmol/L (ref 134–144)
Total Protein: 7 g/dL (ref 6.0–8.5)

## 2016-12-09 LAB — HEMOGLOBIN A1C
Est. average glucose Bld gHb Est-mCnc: 123 mg/dL
Hgb A1c MFr Bld: 5.9 % — ABNORMAL HIGH (ref 4.8–5.6)

## 2016-12-09 LAB — VITAMIN B12: VITAMIN B 12: 341 pg/mL (ref 232–1245)

## 2016-12-09 LAB — TSH: TSH: 3.89 u[IU]/mL (ref 0.450–4.500)

## 2017-01-16 ENCOUNTER — Encounter: Payer: Self-pay | Admitting: Family Medicine

## 2017-02-06 DIAGNOSIS — M7751 Other enthesopathy of right foot: Secondary | ICD-10-CM | POA: Diagnosis not present

## 2017-02-06 DIAGNOSIS — M722 Plantar fascial fibromatosis: Secondary | ICD-10-CM | POA: Diagnosis not present

## 2017-02-06 DIAGNOSIS — M2011 Hallux valgus (acquired), right foot: Secondary | ICD-10-CM | POA: Diagnosis not present

## 2017-02-06 DIAGNOSIS — M2012 Hallux valgus (acquired), left foot: Secondary | ICD-10-CM | POA: Diagnosis not present

## 2017-03-06 ENCOUNTER — Ambulatory Visit (INDEPENDENT_AMBULATORY_CARE_PROVIDER_SITE_OTHER): Payer: BLUE CROSS/BLUE SHIELD | Admitting: Family Medicine

## 2017-03-06 ENCOUNTER — Encounter: Payer: Self-pay | Admitting: Family Medicine

## 2017-03-06 VITALS — BP 126/80 | HR 88 | Temp 98.1°F | Resp 20 | Ht 75.0 in | Wt 308.0 lb

## 2017-03-06 DIAGNOSIS — E669 Obesity, unspecified: Secondary | ICD-10-CM

## 2017-03-06 DIAGNOSIS — Z0000 Encounter for general adult medical examination without abnormal findings: Secondary | ICD-10-CM | POA: Diagnosis not present

## 2017-03-06 DIAGNOSIS — E039 Hypothyroidism, unspecified: Secondary | ICD-10-CM

## 2017-03-06 DIAGNOSIS — Z1211 Encounter for screening for malignant neoplasm of colon: Secondary | ICD-10-CM

## 2017-03-06 DIAGNOSIS — R7303 Prediabetes: Secondary | ICD-10-CM

## 2017-03-06 DIAGNOSIS — Z6838 Body mass index (BMI) 38.0-38.9, adult: Secondary | ICD-10-CM

## 2017-03-06 DIAGNOSIS — L309 Dermatitis, unspecified: Secondary | ICD-10-CM | POA: Diagnosis not present

## 2017-03-06 DIAGNOSIS — K625 Hemorrhage of anus and rectum: Secondary | ICD-10-CM | POA: Insufficient documentation

## 2017-03-06 DIAGNOSIS — G4733 Obstructive sleep apnea (adult) (pediatric): Secondary | ICD-10-CM

## 2017-03-06 DIAGNOSIS — Z86010 Personal history of colonic polyps: Secondary | ICD-10-CM

## 2017-03-06 DIAGNOSIS — E7800 Pure hypercholesterolemia, unspecified: Secondary | ICD-10-CM

## 2017-03-06 DIAGNOSIS — I872 Venous insufficiency (chronic) (peripheral): Secondary | ICD-10-CM | POA: Diagnosis not present

## 2017-03-06 DIAGNOSIS — E038 Other specified hypothyroidism: Secondary | ICD-10-CM

## 2017-03-06 MED ORDER — DESONIDE 0.05 % EX OINT: 1.0000 "application " | TOPICAL_OINTMENT | Freq: Two times a day (BID) | CUTANEOUS | 3 refills | Status: DC

## 2017-03-06 MED ORDER — SIMVASTATIN 40 MG PO TABS: 40.0000 mg | ORAL_TABLET | Freq: Every day | ORAL | 4 refills | Status: DC

## 2017-03-06 MED ORDER — ALLOPURINOL 100 MG PO TABS: ORAL_TABLET | ORAL | 4 refills | Status: DC

## 2017-03-06 NOTE — Assessment & Plan Note (Signed)
None present today Continue desonide, but switched ointment twice daily for flares Discussed eczema precautions, including shorter, cooler showers, moisturizer,allergy control

## 2017-03-06 NOTE — Assessment & Plan Note (Signed)
Given that this is sporadic, suspect it is related to internal hemorrhoids seen on last colonoscopy It is a chronic issue and nothing new He will get a colonoscopy soon for repeat screening which can also see any abnormalities may be causing the bleeding Return precautions discussed

## 2017-03-06 NOTE — Progress Notes (Signed)
Patient: Michael Reyes, Male    DOB: [birth year], 61 y.o.   MRN: [id number] Visit Date: 03/06/2017  Today's Provider: Lavon Paganini, MD   Chief Complaint  Patient presents with  . Annual Exam   Subjective:    Annual physical exam Nim T Scala is a 61 y.o. male who presents today for health maintenance and complete physical. He feels fairly well. He has some complaints, including joint swelling in middle finger of left hand - had cyst drained previously, psoriasis/eczema of ear and eye, for which he takes Desonide cream; and he needs refill on this. He also notes anal bleeding once a month for one day chronically for years. He has been prescribed Nystatin for this in the past, without relief, though admits this may have been for a separate issue.  He reports exercising one weekly; disc golf. He reports he is sleeping well with CPAP.  Last colonoscopy- 06/17/2010- tubular adenoma. Repeat in 10 years ----------------------------------------------------------------- Gout: managed with allopurinol, well controlled   OSA: going well with CPAP, symptoms well controlled  L foot pain: was treated with steroid injection by podiatry, continues to have intermittent pain however, not interfering with daily activities  Occasional cramping of legs at night - no change in this. Recent electrolytes wnl  Swelling of legs: intermittently, goes down overnight, states he once took a diuretic, but was told not to take it any more with gout  R knee pain: s/p menisectomy (partial), takes aleve.  L knee gets stiff especially when driving for a long period of time.  Better with playing disc golf.  Review of Systems  Constitutional: Negative.   HENT: Negative.   Eyes: Negative.   Respiratory: Positive for apnea. Negative for cough, choking, chest tightness, shortness of breath, wheezing and stridor.   Cardiovascular: Positive for leg swelling. Negative for chest pain and palpitations.    Gastrointestinal: Positive for anal bleeding. Negative for abdominal distention, abdominal pain, blood in stool, constipation, diarrhea, nausea, rectal pain and vomiting.  Endocrine: Negative.   Genitourinary: Negative.   Musculoskeletal: Positive for arthralgias. Negative for back pain, gait problem, joint swelling, myalgias, neck pain and neck stiffness.  Skin: Negative.   Allergic/Immunologic: Negative.   Neurological: Negative.   Hematological: Negative.   Psychiatric/Behavioral: Negative.     Social History      He  reports that  has never smoked. he has never used smokeless tobacco. He reports that he drinks alcohol. He reports that he does not use drugs.       Social History   Socioeconomic History  . Marital status: Married    Spouse name: None  . Number of children: 2  . Years of education: PHD  . Highest education level: None  Social Needs  . Financial resource strain: None  . Food insecurity - worry: None  . Food insecurity - inability: None  . Transportation needs - medical: None  . Transportation needs - non-medical: None  Occupational History  . Occupation: Professor  Tobacco Use  . Smoking status: Never Smoker  . Smokeless tobacco: Never Used  Substance and Sexual Activity  . Alcohol use: Yes    Alcohol/week: 0.0 oz    Comment: Occasionally  . Drug use: No  . Sexual activity: Yes  Other Topics Concern  . None  Social History Narrative  . None    History reviewed. No pertinent past medical history.   Patient Active Problem List   Diagnosis Date Noted  .  Dribbling following urination 12/08/2016  . Thigh numbness 12/08/2016  . Ganglion cyst of finger of left hand 12/08/2016  . History of adenomatous polyp of colon 02/24/2016  . Obesity 02/24/2016  . Allergic rhinitis 10/01/2015  . Chronic cough 10/01/2015  . Venous insufficiency of leg 07/27/2015  . Anxiety 04/02/2015  . Arteriosclerosis of coronary artery 04/02/2015  . Dermatitis, eczematoid  04/02/2015  . Prediabetes 04/02/2015  . HLD (hyperlipidemia) 04/02/2015  . Disorder of mitral valve 04/02/2015  . Subclinical hypothyroidism 04/02/2015  . Heart valve disease 04/02/2015  . OSA (obstructive sleep apnea) 03/30/2014  . Arthritis due to gout 05/08/2008  . ED (erectile dysfunction) of organic origin 05/05/2008    Past Surgical History:  Procedure Laterality Date  . COLONOSCOPY  2012  . FINGER SURGERY Left 01/2016   cyst removal   . KNEE SURGERY Right 12/2013  . VASECTOMY      Family History        Family Status  Relation Name Status  . Mother  Deceased  . Father  Deceased at age 27's  . Sister  Deceased  . Brother  Alive        His family history includes Cancer in his sister; Diabetes in his father and sister; Healthy in his brother; Heart disease in his father; Obesity in his sister; Tongue cancer in his mother.     No Known Allergies   Current Outpatient Medications:  .  allopurinol (ZYLOPRIM) 100 MG tablet, Take 2 tablets by mouth  once a day as directed, Disp: 180 tablet, Rfl: 4 .  aspirin 81 MG tablet, Take 81 mg by mouth daily. , Disp: , Rfl:  .  GLUCOSAMINE-CHONDROIT-VIT C-MN PO, Take 2 tablets by mouth daily. , Disp: , Rfl:  .  MULTIPLE VITAMIN PO, Take 1 tablet by mouth daily. , Disp: , Rfl:  .  simvastatin (ZOCOR) 40 MG tablet, Take 1 tablet (40 mg total) by mouth daily., Disp: 90 tablet, Rfl: 4   Patient Care Team: Birdie Sons, MD as PCP - General (Family Medicine)      Objective:   Vitals: BP 126/80 (BP Location: Left Arm, Patient Position: Sitting, Cuff Size: Large)   Pulse 88   Temp 98.1 F (36.7 C) (Oral)   Resp 20   Ht 6\' 3"  (1.905 m)   Wt (!) 308 lb (139.7 kg)   BMI 38.50 kg/m    Vitals:   03/06/17 0913  BP: 126/80  Pulse: 88  Resp: 20  Temp: 98.1 F (36.7 C)  TempSrc: Oral  Weight: (!) 308 lb (139.7 kg)  Height: 6\' 3"  (1.905 m)     Physical Exam  Constitutional: He is oriented to person, place, and time. He  appears well-developed and well-nourished. No distress.  HENT:  Head: Normocephalic and atraumatic.  Right Ear: External ear normal.  Left Ear: External ear normal.  Nose: Nose normal.  Mouth/Throat: Oropharynx is clear and moist. No oropharyngeal exudate.  Eyes: Conjunctivae are normal. Pupils are equal, round, and reactive to light. No scleral icterus.  Neck: Neck supple. No thyromegaly present.  Cardiovascular: Normal rate, regular rhythm, normal heart sounds and intact distal pulses.  No murmur heard. Pulmonary/Chest: Effort normal and breath sounds normal. No respiratory distress. He has no wheezes. He has no rales.  Abdominal: Soft. Bowel sounds are normal. He exhibits no distension. There is no tenderness. There is no rebound and no guarding.  Musculoskeletal: He exhibits edema (trace). He exhibits no deformity.  B/l Knee:  Normal to inspection with no erythema or effusion or obvious bony abnormalities.  Palpation normal with no warmth or joint line tenderness or patellar tenderness or condyle tenderness. ROM normal in flexion and extension and lower leg rotation.  Lymphadenopathy:    He has no cervical adenopathy.  Neurological: He is alert and oriented to person, place, and time. No cranial nerve deficit.  Skin: Skin is warm and dry. No rash noted.  Psychiatric: He has a normal mood and affect. His behavior is normal.  Vitals reviewed.  Patient declines rectal exam today  Depression Screen PHQ 2/9 Scores 03/06/2017 02/24/2016  PHQ - 2 Score 0 0  PHQ- 9 Score - 0     Assessment & Plan:     Routine Health Maintenance and Physical Exam  Exercise Activities and Dietary recommendations Goals    None      Immunization History  Administered Date(s) Administered  . Hepatitis A 04/06/2010, 12/09/2010  . Hepatitis B, adult 03/28/2016, 05/16/2016, 09/27/2016  . Influenza-Unspecified 01/30/2015, 01/09/2017  . Tdap 11/18/2013    Health Maintenance  Topic Date Due  .  HIV Screening  03/26/1971  . COLONOSCOPY  06/17/2020  . TETANUS/TDAP  11/19/2023  . INFLUENZA VACCINE  Completed  . Hepatitis C Screening  Completed     Discussed health benefits of physical activity, and encouraged him to engage in regular exercise appropriate for his age and condition.     Problem List Items Addressed This Visit      Cardiovascular and Mediastinum   Venous insufficiency of leg    Mild, no signs of CHF or pulmonary edema Reassured patient that this is reassuring that it goes down overnight Discussed weight loss, elevation, compression stockings Would not use a diuretic for this at this time      Relevant Medications   simvastatin (ZOCOR) 40 MG tablet     Respiratory   OSA (obstructive sleep apnea)    Well-controlled Continue Cpap        Digestive   Rectal bleeding    Given that this is sporadic, suspect it is related to internal hemorrhoids seen on last colonoscopy It is a chronic issue and nothing new He will get a colonoscopy soon for repeat screening which can also see any abnormalities may be causing the bleeding Return precautions discussed        Endocrine   Subclinical hypothyroidism    Recent TSH within normal limits        Musculoskeletal and Integument   Dermatitis, eczematoid    None present today Continue desonide, but switched ointment twice daily for flares Discussed eczema precautions, including shorter, cooler showers, moisturizer,allergy control        Other   Prediabetes    Recent A1c remains in prediabetic range Discussed diet and exercise A long discussion regarding diabetic diet and carbohydrates      Hypercholesteremia    Continue simvastatin Recheck lipid panel, CMP      Relevant Medications   simvastatin (ZOCOR) 40 MG tablet   Other Relevant Orders   Lipid panel   Lipid panel   CBC with Differential/Platelet   History of adenomatous polyp of colon    Due for repeat screening colonoscopy Referral to  gastroenterology Previously seen by Dr. Collene Mares      Relevant Orders   Ambulatory referral to Gastroenterology   Obesity    Long discussion about diet and exercise, importance of maintaining a healthy weight, how the patient's other medical problems, including knee pain,leg swelling,  prediabetes, erectile dysfunction, OSA are related to his obesity       Other Visit Diagnoses    Healthcare maintenance    -  Primary   Screen for colon cancer       Relevant Orders   Ambulatory referral to Gastroenterology      -------------------------------------------------------------------- Return in about 6 months (around 09/03/2017) for prediabetes, obesity f/u.   The entirety of the information documented in the History of Present Illness, Review of Systems and Physical Exam were personally obtained by me. Portions of this information were initially documented by Raquel Sarna Ratchford, CMA and reviewed by me for thoroughness and accuracy.     Lavon Paganini, MD  [facility]

## 2017-03-06 NOTE — Assessment & Plan Note (Signed)
Recent A1c remains in prediabetic range Discussed diet and exercise A long discussion regarding diabetic diet and carbohydrates

## 2017-03-06 NOTE — Assessment & Plan Note (Signed)
Well-controlled Continue Cpap

## 2017-03-06 NOTE — Assessment & Plan Note (Signed)
Continue simvastatin Recheck lipid panel, CMP

## 2017-03-06 NOTE — Assessment & Plan Note (Signed)
Mild, no signs of CHF or pulmonary edema Reassured patient that this is reassuring that it goes down overnight Discussed weight loss, elevation, compression stockings Would not use a diuretic for this at this time

## 2017-03-06 NOTE — Assessment & Plan Note (Signed)
Long discussion about diet and exercise, importance of maintaining a healthy weight, how the patient's other medical problems, including knee pain,leg swelling, prediabetes, erectile dysfunction, OSA are related to his obesity

## 2017-03-06 NOTE — Assessment & Plan Note (Signed)
Recent TSH within normal limits.  

## 2017-03-06 NOTE — Assessment & Plan Note (Signed)
Due for repeat screening colonoscopy Referral to gastroenterology Previously seen by Dr. Collene Mares

## 2017-03-06 NOTE — Patient Instructions (Addendum)
Diet Recommendations for Diabetes   Starchy (carb) foods include: Bread, rice, pasta, potatoes, corn, crackers, bagels, muffins, all baked goods.  (Fruits, milk, and yogurt also have carbohydrate, but most of these foods will not spike your blood sugar as the starchy foods will.)  A few fruits do cause high blood sugars; use small portions of bananas (limit to 1/2 at a time), grapes, watermelon, and most tropical fruits.    Protein foods include: Meat, fish, poultry, eggs, dairy foods, and beans such as pinto and kidney beans (beans also provide carbohydrate).   1. Eat at least 3 meals and 1-2 snacks per day. Never go more than 4-5 hours while awake without eating. Eat breakfast within the first hour of getting up.   2. Limit starchy foods to TWO per meal and ONE per snack. ONE portion of a starchy  food is equal to the following:   - ONE slice of bread (or its equivalent, such as half of a hamburger bun).   - 1/2 cup of a "scoopable" starchy food such as potatoes or rice.   - 15 grams of carbohydrate as shown on food label.  3. Include at every meal: a protein food, a carb food, and vegetables and/or fruit.   - Obtain twice as many veg's as protein or carbohydrate foods for both lunch and dinner.   - Fresh or frozen veg's are best.   - Try to keep frozen veg's on hand for a quick vegetable serving.         Preventive Care 40-64 Years, Male Preventive care refers to lifestyle choices and visits with your health care provider that can promote health and wellness. What does preventive care include?  A yearly physical exam. This is also called an annual well check.  Dental exams once or twice a year.  Routine eye exams. Ask your health care provider how often you should have your eyes checked.  Personal lifestyle choices, including: ? Daily care of your teeth and gums. ? Regular physical activity. ? Eating a healthy diet. ? Avoiding tobacco and drug use. ? Limiting alcohol  use. ? Practicing safe sex. ? Taking low-dose aspirin every day starting at age 46. What happens during an annual well check? The services and screenings done by your health care provider during your annual well check will depend on your age, overall health, lifestyle risk factors, and family history of disease. Counseling Your health care provider may ask you questions about your:  Alcohol use.  Tobacco use.  Drug use.  Emotional well-being.  Home and relationship well-being.  Sexual activity.  Eating habits.  Work and work Statistician.  Screening You may have the following tests or measurements:  Height, weight, and BMI.  Blood pressure.  Lipid and cholesterol levels. These may be checked every 5 years, or more frequently if you are over 54 years old.  Skin check.  Lung cancer screening. You may have this screening every year starting at age 4 if you have a 30-pack-year history of smoking and currently smoke or have quit within the past 15 years.  Fecal occult blood test (FOBT) of the stool. You may have this test every year starting at age 22.  Flexible sigmoidoscopy or colonoscopy. You may have a sigmoidoscopy every 5 years or a colonoscopy every 10 years starting at age 40.  Prostate cancer screening. Recommendations will vary depending on your family history and other risks.  Hepatitis C blood test.  Hepatitis B blood test.  Sexually  transmitted disease (STD) testing.  Diabetes screening. This is done by checking your blood sugar (glucose) after you have not eaten for a while (fasting). You may have this done every 1-3 years.  Discuss your test results, treatment options, and if necessary, the need for more tests with your health care provider. Vaccines Your health care provider may recommend certain vaccines, such as:  Influenza vaccine. This is recommended every year.  Tetanus, diphtheria, and acellular pertussis (Tdap, Td) vaccine. You may need a Td  booster every 10 years.  Varicella vaccine. You may need this if you have not been vaccinated.  Zoster vaccine. You may need this after age 68.  Measles, mumps, and rubella (MMR) vaccine. You may need at least one dose of MMR if you were born in 1957 or later. You may also need a second dose.  Pneumococcal 13-valent conjugate (PCV13) vaccine. You may need this if you have certain conditions and have not been vaccinated.  Pneumococcal polysaccharide (PPSV23) vaccine. You may need one or two doses if you smoke cigarettes or if you have certain conditions.  Meningococcal vaccine. You may need this if you have certain conditions.  Hepatitis A vaccine. You may need this if you have certain conditions or if you travel or work in places where you may be exposed to hepatitis A.  Hepatitis B vaccine. You may need this if you have certain conditions or if you travel or work in places where you may be exposed to hepatitis B.  Haemophilus influenzae type b (Hib) vaccine. You may need this if you have certain risk factors.  Talk to your health care provider about which screenings and vaccines you need and how often you need them. This information is not intended to replace advice given to you by your health care provider. Make sure you discuss any questions you have with your health care provider. Document Released: 05/14/2015 Document Revised: 01/05/2016 Document Reviewed: 02/16/2015 Elsevier Interactive Patient Education  2017 [employer].

## 2017-03-07 ENCOUNTER — Telehealth: Payer: Self-pay

## 2017-03-07 LAB — CBC WITH DIFFERENTIAL/PLATELET
BASOS: 1 %
Basophils Absolute: 0.1 10*3/uL (ref 0.0–0.2)
EOS (ABSOLUTE): 0.2 10*3/uL (ref 0.0–0.4)
Eos: 4 %
HEMOGLOBIN: 16.2 g/dL (ref 13.0–17.7)
Hematocrit: 46.7 % (ref 37.5–51.0)
IMMATURE GRANS (ABS): 0 10*3/uL (ref 0.0–0.1)
Immature Granulocytes: 1 %
LYMPHS ABS: 1.6 10*3/uL (ref 0.7–3.1)
LYMPHS: 32 %
MCH: 32.4 pg (ref 26.6–33.0)
MCHC: 34.7 g/dL (ref 31.5–35.7)
MCV: 93 fL (ref 79–97)
Monocytes Absolute: 0.6 10*3/uL (ref 0.1–0.9)
Monocytes: 12 %
Neutrophils Absolute: 2.4 10*3/uL (ref 1.4–7.0)
Neutrophils: 50 %
Platelets: 228 10*3/uL (ref 150–379)
RBC: 5 x10E6/uL (ref 4.14–5.80)
RDW: 13.8 % (ref 12.3–15.4)
WBC: 4.8 10*3/uL (ref 3.4–10.8)

## 2017-03-07 LAB — LIPID PANEL
CHOLESTEROL TOTAL: 185 mg/dL (ref 100–199)
Chol/HDL Ratio: 4.6 ratio (ref 0.0–5.0)
HDL: 40 mg/dL (ref >39)
LDL CALC: 101 mg/dL — AB (ref 0–99)
TRIGLYCERIDES: 220 mg/dL — AB (ref 0–149)
VLDL Cholesterol Cal: 44 mg/dL — ABNORMAL HIGH (ref 5–40)

## 2017-03-07 LAB — SPECIMEN STATUS REPORT

## 2017-03-07 NOTE — Telephone Encounter (Signed)
Pt is returning a call.  CB#[phone]/MW

## 2017-03-07 NOTE — Telephone Encounter (Signed)
-----   Message from Virginia Crews, MD sent at 03/07/2017  2:03 PM EST ----- Cholesterol has actually gone up in the last year.  Make sure patient is taking zocor regularly.  If he is, we might want to consider a stronger statin, such as Lipitor.  Normal blood counts.  Virginia Crews, MD, MPH [facility] 03/07/2017 2:03 PM

## 2017-03-07 NOTE — Telephone Encounter (Signed)
[email or web address]

## 2017-03-13 ENCOUNTER — Ambulatory Visit (INDEPENDENT_AMBULATORY_CARE_PROVIDER_SITE_OTHER): Payer: BLUE CROSS/BLUE SHIELD | Admitting: Family Medicine

## 2017-03-13 ENCOUNTER — Encounter: Payer: Self-pay | Admitting: Family Medicine

## 2017-03-13 VITALS — BP 124/80 | HR 84 | Temp 97.7°F | Resp 16 | Wt 313.0 lb

## 2017-03-13 DIAGNOSIS — M67442 Ganglion, left hand: Secondary | ICD-10-CM

## 2017-03-13 DIAGNOSIS — E7800 Pure hypercholesterolemia, unspecified: Secondary | ICD-10-CM | POA: Diagnosis not present

## 2017-03-13 DIAGNOSIS — M6740 Ganglion, unspecified site: Secondary | ICD-10-CM

## 2017-03-13 MED ORDER — SIMVASTATIN 80 MG PO TABS
80.0000 mg | ORAL_TABLET | Freq: Every day | ORAL | 2 refills | Status: DC
Start: 2017-03-13 — End: 2017-09-06

## 2017-03-13 NOTE — Assessment & Plan Note (Signed)
Patient was given informed verbal consent. Appropriate time out was taken. Area prepped and draped in usual sterile fashion. 0.25 cc of depomedrol 40 mg/ml plus  0.25 cc of 1% lidocaine without epinephrine was injected into the ganglion cyst of middle finger DIP after aspiration of clear fluid. The patient tolerated the procedure well. There were no complications. Post procedure instructions were given.

## 2017-03-13 NOTE — Telephone Encounter (Signed)
Pt advised at [place].

## 2017-03-13 NOTE — Progress Notes (Signed)
Patient: Michael Reyes Male    DOB: [birth year]   61 y.o.   MRN: [id number] Visit Date: 03/13/2017  Today's Provider: Lavon Paganini, MD   Chief Complaint  Patient presents with  . Cyst  . Hyperlipidemia   Subjective:    HPI   Pt presents for evaluation of cyst on middle finger of left hand. He is requesting an I&D of this cyst. He states the dermatologist performed I&D's in the past, with relief.  It sometimes enlarges and is TTP.  Not worse with movement, no drainage, surrounding erythema, fevers.   Has not tried any treatments to this particular area  Michael Reyes last lipids were elevated. He states he is taking statin regularly. Denies CP, SOB, myalgias.   Lab Results  Component Value Date   CHOL 185 03/06/2017   HDL 40 03/06/2017   LDLCALC 101 (H) 03/06/2017   TRIG 220 (H) 03/06/2017   CHOLHDL 4.6 03/06/2017     No Known Allergies   Current Outpatient Medications:  .  allopurinol (ZYLOPRIM) 100 MG tablet, Take 2 tablets by mouth  once a day as directed, Disp: 180 tablet, Rfl: 4 .  aspirin 81 MG tablet, Take 81 mg by mouth daily. , Disp: , Rfl:  .  desonide (DESOWEN) 0.05 % ointment, Apply 1 application 2 (two) times daily topically., Disp: 60 g, Rfl: 3 .  GLUCOSAMINE-CHONDROIT-VIT C-MN PO, Take 2 tablets by mouth daily. , Disp: , Rfl:  .  MULTIPLE VITAMIN PO, Take 1 tablet by mouth daily. , Disp: , Rfl:  .  simvastatin (ZOCOR) 40 MG tablet, Take 1 tablet (40 mg total) daily by mouth., Disp: 90 tablet, Rfl: 4  Review of Systems  Constitutional: Negative for activity change, appetite change, chills, diaphoresis, fatigue, fever and unexpected weight change.  Respiratory: Negative for shortness of breath.   Cardiovascular: Positive for leg swelling. Negative for chest pain and palpitations.    Social History   Tobacco Use  . Smoking status: Never Smoker  . Smokeless tobacco: Never Used  Substance Use Topics  . Alcohol use: Yes    Alcohol/week: 0.0  oz    Comment: Occasionally   Objective:   BP 124/80 (BP Location: Left Arm, Patient Position: Sitting, Cuff Size: Large)   Pulse 84   Temp 97.7 F (36.5 C) (Oral)   Resp 16   Wt (!) 313 lb (142 kg)   BMI 39.12 kg/m  Vitals:   03/13/17 1058  BP: 124/80  Pulse: 84  Resp: 16  Temp: 97.7 F (36.5 C)  TempSrc: Oral  Weight: (!) 313 lb (142 kg)     Physical Exam  Constitutional: He is oriented to person, place, and time. He appears well-developed and well-nourished. No distress.  HENT:  Head: Normocephalic and atraumatic.  Cardiovascular: Normal rate, regular rhythm and intact distal pulses.  Pulmonary/Chest: Effort normal. No respiratory distress.  Musculoskeletal: He exhibits no edema.  Ganglion cyst over L 3rd finger DIP  Neurological: He is alert and oriented to person, place, and time.  Skin: Skin is warm and dry. No rash noted.  Psychiatric: He has a normal mood and affect. His behavior is normal.  Vitals reviewed.       Assessment & Plan:      Problem List Items Addressed This Visit      Other   Hypercholesteremia - Primary    LDL increasing and >100 Denies missed doses or decreased compliance Increase simvastatin dose to 80mg  daily  Recheck LDL in 3 months      Relevant Medications   simvastatin (ZOCOR) 80 MG tablet   Ganglion cyst of finger of left hand    Patient was given informed verbal consent. Appropriate time out was taken. Area prepped and draped in usual sterile fashion. 0.25 cc of depomedrol 40 mg/ml plus  0.25 cc of 1% lidocaine without epinephrine was injected into the ganglion cyst of middle finger DIP after aspiration of clear fluid. The patient tolerated the procedure well. There were no complications. Post procedure instructions were given.        Other Visit Diagnoses    Ganglion cyst              The entirety of the information documented in the History of Present Illness, Review of Systems and Physical Exam were personally  obtained by me. Portions of this information were initially documented by Raquel Sarna Ratchford, CMA and reviewed by me for thoroughness and accuracy.     Lavon Paganini, MD  [facility]

## 2017-03-13 NOTE — Assessment & Plan Note (Signed)
LDL increasing and >100 Denies missed doses or decreased compliance Increase simvastatin dose to 80mg  daily Recheck LDL in 3 months

## 2017-03-27 DIAGNOSIS — Z86010 Personal history of colonic polyps: Secondary | ICD-10-CM | POA: Diagnosis not present

## 2017-03-27 DIAGNOSIS — K5730 Diverticulosis of large intestine without perforation or abscess without bleeding: Secondary | ICD-10-CM | POA: Diagnosis not present

## 2017-03-27 DIAGNOSIS — R635 Abnormal weight gain: Secondary | ICD-10-CM | POA: Diagnosis not present

## 2017-03-27 DIAGNOSIS — Z1211 Encounter for screening for malignant neoplasm of colon: Secondary | ICD-10-CM | POA: Diagnosis not present

## 2017-04-18 DIAGNOSIS — Z1211 Encounter for screening for malignant neoplasm of colon: Secondary | ICD-10-CM | POA: Diagnosis not present

## 2017-04-18 DIAGNOSIS — K635 Polyp of colon: Secondary | ICD-10-CM | POA: Diagnosis not present

## 2017-04-18 DIAGNOSIS — K5730 Diverticulosis of large intestine without perforation or abscess without bleeding: Secondary | ICD-10-CM | POA: Diagnosis not present

## 2017-04-18 DIAGNOSIS — D123 Benign neoplasm of transverse colon: Secondary | ICD-10-CM | POA: Diagnosis not present

## 2017-04-18 DIAGNOSIS — K639 Disease of intestine, unspecified: Secondary | ICD-10-CM | POA: Diagnosis not present

## 2017-04-18 DIAGNOSIS — D122 Benign neoplasm of ascending colon: Secondary | ICD-10-CM | POA: Diagnosis not present

## 2017-04-18 LAB — HM COLONOSCOPY

## 2017-04-19 ENCOUNTER — Encounter: Payer: Self-pay | Admitting: Gastroenterology

## 2017-05-22 ENCOUNTER — Encounter: Payer: Self-pay | Admitting: Family Medicine

## 2017-06-03 ENCOUNTER — Other Ambulatory Visit: Payer: Self-pay | Admitting: Family Medicine

## 2017-06-04 NOTE — Telephone Encounter (Signed)
Pt currently taking 80 mg of simvastatin.

## 2017-09-06 ENCOUNTER — Encounter: Payer: Self-pay | Admitting: Family Medicine

## 2017-09-06 ENCOUNTER — Ambulatory Visit: Payer: BLUE CROSS/BLUE SHIELD | Admitting: Family Medicine

## 2017-09-06 ENCOUNTER — Ambulatory Visit: Payer: Self-pay | Admitting: Family Medicine

## 2017-09-06 VITALS — BP 128/88 | HR 85 | Temp 97.8°F | Resp 20 | Wt 309.0 lb

## 2017-09-06 DIAGNOSIS — E669 Obesity, unspecified: Secondary | ICD-10-CM | POA: Diagnosis not present

## 2017-09-06 DIAGNOSIS — E7800 Pure hypercholesterolemia, unspecified: Secondary | ICD-10-CM

## 2017-09-06 DIAGNOSIS — R7303 Prediabetes: Secondary | ICD-10-CM | POA: Diagnosis not present

## 2017-09-06 DIAGNOSIS — Z6838 Body mass index (BMI) 38.0-38.9, adult: Secondary | ICD-10-CM

## 2017-09-06 DIAGNOSIS — E66812 Obesity, class 2: Secondary | ICD-10-CM

## 2017-09-06 LAB — POCT GLYCOSYLATED HEMOGLOBIN (HGB A1C)
Est. average glucose Bld gHb Est-mCnc: 131
Hemoglobin A1C: 6.2

## 2017-09-06 MED ORDER — SIMVASTATIN 40 MG PO TABS: 40.0000 mg | ORAL_TABLET | Freq: Every day | ORAL | 3 refills | Status: DC

## 2017-09-06 NOTE — Progress Notes (Signed)
Patient: Michael Reyes Male    DOB: [birth year]   62 y.o.   MRN: [id number] Visit Date: 09/06/2017  Today's Provider: Lavon Paganini, MD   Chief Complaint  Patient presents with  . Hyperglycemia  . Obesity   Subjective:    HPI      Hyperglycemia, Follow-up:   Lab Results  Component Value Date   HGBA1C 6.2 09/06/2017   HGBA1C 5.9 (H) 12/08/2016   HGBA1C 5.9 (H) 02/29/2016   GLUCOSE 123 (H) 12/08/2016   GLUCOSE 113 (H) 04/20/2015    Last seen for for this 6 months ago.  Management since then includes encouraging pt to diet and exercise. Current symptoms include none and have been stable.  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 Changes made at last visit include advising pt that other medical problems are related to obesity, and encouraging him to diet and exercise.  He reports good compliance with treatment. He feels that condition is Unchanged.  ------------------------------------------------------------------------------------ Pt would like to discuss Simvastatin. Pt was advised to increase this medication to 80 mg po qd, but he noticed left sided clavicle pain, which felt like a "crick in the neck". Pt states he decreased the medication back to 40 mg, and the pain has resolved. He is questioning if he needs another medication.    No Known Allergies   Current Outpatient Medications:  .  allopurinol (ZYLOPRIM) 100 MG tablet, Take 2  tablets by mouth  once a day as directed, Disp: 180 tablet, Rfl: 4 .  aspirin 81 MG tablet, Take 81 mg by mouth daily. , Disp: , Rfl:  .  desonide (DESOWEN) 0.05 % ointment, Apply 1 application 2 (two) times daily topically., Disp: 60 g, Rfl: 3 .  GLUCOSAMINE-CHONDROIT-VIT C-MN PO, Take 2 tablets by mouth daily. , Disp: , Rfl:  .  MULTIPLE VITAMIN PO, Take 1 tablet by mouth daily. , Disp: , Rfl:  .  simvastatin (ZOCOR) 40 MG tablet, Take 1 tablet (40 mg total) by mouth daily., Disp: 90 tablet, Rfl: 3  Review of Systems  Constitutional: Negative for activity change, appetite change, chills, diaphoresis, fatigue, fever and unexpected weight change.  HENT: Negative.   Respiratory: Negative.  Negative for shortness of breath.   Cardiovascular: Negative for chest pain, palpitations and leg swelling.  Gastrointestinal: Negative.   Endocrine: Negative for polydipsia, polyphagia and polyuria.  Genitourinary: Negative.   Musculoskeletal: Negative.   Neurological: Negative.   Psychiatric/Behavioral: Negative.     Social History   Tobacco Use  . Smoking status: Never Smoker  . Smokeless tobacco: Never Used  Substance Use Topics  . Alcohol use: Yes    Alcohol/week: 0.0 oz    Comment: Occasionally   Objective:   BP 128/88 (BP Location: Left Arm, Patient Position: Sitting, Cuff Size: Large)   Pulse 85   Temp 97.8 F (36.6 C) (Oral)  Resp 20   Wt (!) 309 lb (140.2 kg)   SpO2 95%   BMI 38.62 kg/m  Vitals:   09/06/17 0826 09/06/17 0858  BP: 132/90 128/88  Pulse: 85   Resp: 20   Temp: 97.8 F (36.6 C)   TempSrc: Oral   SpO2: 95%   Weight: (!) 309 lb (140.2 kg)      Physical Exam  Constitutional: He is oriented to person, place, and time. He appears well-developed and well-nourished.  HENT:  Head: Normocephalic and atraumatic.  Eyes: Conjunctivae are normal. Right eye exhibits no discharge. Left eye exhibits no discharge. No scleral icterus.  Neck: Neck supple. No thyromegaly  present.  Cardiovascular: Normal rate, regular rhythm, normal heart sounds and intact distal pulses.  No murmur heard. Pulmonary/Chest: Effort normal and breath sounds normal. No respiratory distress. He has no wheezes. He has no rales.  Musculoskeletal: He exhibits no edema or deformity.  Lymphadenopathy:    He has no cervical adenopathy.  Neurological: He is alert and oriented to person, place, and time.  Skin: Skin is warm and dry. Capillary refill takes less than 2 seconds. No rash noted.  Psychiatric: He has a normal mood and affect. His behavior is normal.  Vitals reviewed.      Results for orders placed or performed in visit on 09/06/17  POCT glycosylated hemoglobin (Hb A1C)  Result Value Ref Range   Hemoglobin A1C 6.2    Est. average glucose Bld gHb Est-mCnc 131     Assessment & Plan:   Problem List Items Addressed This Visit      Other   Prediabetes - Primary    A1c remains in prediabetes range but has increased from 5.9 to 6.2 in last 6 months Discussed low carb diet and exercise Discussed importance of preventing progression to DM F/u in 6 months      Relevant Orders   POCT glycosylated hemoglobin (Hb A1C) (Completed)   Hypercholesteremia    On last check LDL had increased from 78 to 101 and goal is <100 Did not tolerate higher dose simvastatin Continue simvastatin 40mg  daily Recheck in 6 months Work on diet and exercise Could consider atorvastatin in future if higher intensity needed      Relevant Medications   simvastatin (ZOCOR) 40 MG tablet   Obesity    Discussed importance of healthy wieght, diet, exercise          Return in about 6 months (around 03/09/2018) for CPE.   The entirety of the information documented in the History of Present Illness, Review of Systems and Physical Exam were personally obtained by me. Portions of this information were initially documented by Raquel Sarna Ratchford, CMA and reviewed by me for thoroughness and accuracy.     Virginia Crews, MD, MPH [facility] 09/06/2017 10:23 AM

## 2017-09-06 NOTE — Assessment & Plan Note (Signed)
On last check LDL had increased from 78 to 101 and goal is <100 Did not tolerate higher dose simvastatin Continue simvastatin 40mg  daily Recheck in 6 months Work on diet and exercise Could consider atorvastatin in future if higher intensity needed

## 2017-09-06 NOTE — Assessment & Plan Note (Signed)
A1c remains in prediabetes range but has increased from 5.9 to 6.2 in last 6 months Discussed low carb diet and exercise Discussed importance of preventing progression to DM F/u in 6 months

## 2017-09-06 NOTE — Patient Instructions (Signed)

## 2017-09-06 NOTE — Assessment & Plan Note (Signed)
Discussed importance of healthy wieght, diet, exercise

## 2017-11-14 ENCOUNTER — Ambulatory Visit: Payer: BLUE CROSS/BLUE SHIELD | Admitting: Family Medicine

## 2017-11-14 ENCOUNTER — Encounter: Payer: Self-pay | Admitting: Family Medicine

## 2017-11-14 ENCOUNTER — Ambulatory Visit (INDEPENDENT_AMBULATORY_CARE_PROVIDER_SITE_OTHER): Payer: BLUE CROSS/BLUE SHIELD | Admitting: General Surgery

## 2017-11-14 ENCOUNTER — Encounter: Payer: Self-pay | Admitting: General Surgery

## 2017-11-14 VITALS — BP 104/62 | HR 56 | Resp 14 | Ht 72.0 in | Wt 278.0 lb

## 2017-11-14 VITALS — BP 112/82 | HR 85 | Temp 97.5°F | Resp 16 | Wt 278.0 lb

## 2017-11-14 DIAGNOSIS — E7800 Pure hypercholesterolemia, unspecified: Secondary | ICD-10-CM | POA: Diagnosis not present

## 2017-11-14 DIAGNOSIS — R7303 Prediabetes: Secondary | ICD-10-CM

## 2017-11-14 DIAGNOSIS — S61211A Laceration without foreign body of left index finger without damage to nail, initial encounter: Secondary | ICD-10-CM

## 2017-11-14 MED ORDER — HYDROCODONE-ACETAMINOPHEN 5-325 MG PO TABS: 1.0000 | ORAL_TABLET | ORAL | 0 refills | Status: DC | PRN

## 2017-11-14 NOTE — Assessment & Plan Note (Signed)
Patient has made great dietary changes since last visit Discussed it is too early to check his A1c again Discussed low-carb diet in great detail Discussed importance of preventing progression to diabetes

## 2017-11-14 NOTE — Assessment & Plan Note (Signed)
Recheck lipid panel and CMP Continue simvastatin 40 mg daily presently Did not tolerate higher dose of simvastatin due to myalgias Goal LDL less than 100 Patient is concerned that his recent dietary changes may have increased his lipids Encouraged exercise Could consider atorvastatin if LDL is above goal

## 2017-11-14 NOTE — Patient Instructions (Signed)
The patient is aware to call back for any questions or concerns.  

## 2017-11-14 NOTE — Progress Notes (Signed)
Patient: Michael Reyes Male    DOB: November 19, 1955   62 y.o.   MRN: [id number] Visit Date: 11/14/2017  Today's Provider: Lavon Paganini, MD   I, Martha Clan, CMA, am acting as scribe for Lavon Paganini, MD.  Chief Complaint  Patient presents with  . Finger Injury   Subjective:    HPI   Patient presents today for injury to his left index finger.  He was chopping swiss chard last night and missed took his fingertip for the vegetable.  He wrapped it up last night and it did not leak through the dressing overnight.  Tetanus shot is up-to-date.  On aspirin but no anticoagulation.  He is not sure where the missing tip of his finger went.  He is also concerned about his prediabetes.  He states that since this was diagnosed in 08/2017 when his A1c was elevated, he has changed his diet.  He has cut out almost all of his carbohydrate intake.  He is happy about his weight loss due to this.  He is concerned about his cholesterol, however, as he has been eating more meat and eggs in lieu of carbohydrates.  He is currently taking simvastatin 40 mg daily.  He was tried on 80 mg daily, but developed myalgias in his shoulders due to this.  He wonders if he could have his A1c and lipid panel rechecked at this time.  No Known Allergies   Current Outpatient Medications:  .  allopurinol (ZYLOPRIM) 100 MG tablet, Take 2 tablets by mouth  once a day as directed, Disp: 180 tablet, Rfl: 4 .  aspirin 81 MG tablet, Take 81 mg by mouth daily. , Disp: , Rfl:  .  GLUCOSAMINE-CHONDROIT-VIT C-MN PO, Take 2 tablets by mouth daily. , Disp: , Rfl:  .  MULTIPLE VITAMIN PO, Take 1 tablet by mouth daily. , Disp: , Rfl:  .  simvastatin (ZOCOR) 40 MG tablet, Take 1 tablet (40 mg total) by mouth daily., Disp: 90 tablet, Rfl: 3 .  HYDROcodone-acetaminophen (NORCO/VICODIN) 5-325 MG tablet, Take 1 tablet by mouth every 4 (four) hours as needed for moderate pain., Disp: 12 tablet, Rfl: 0  Review of Systems    Constitutional: Negative.   HENT: Negative.   Eyes: Negative.   Respiratory: Negative.   Cardiovascular: Negative.   Gastrointestinal: Negative.   Genitourinary: Negative.   Musculoskeletal: Negative.   Neurological: Negative.   Psychiatric/Behavioral: Negative.     Social History   Tobacco Use  . Smoking status: Never Smoker  . Smokeless tobacco: Never Used  Substance Use Topics  . Alcohol use: Yes    Alcohol/week: 0.0 oz    Comment: Occasionally   Objective:   BP 112/82 (BP Location: Right Arm, Patient Position: Sitting, Cuff Size: Large)   Pulse 85   Temp (!) 97.5 F (36.4 C) (Oral)   Resp 16   Wt 278 lb (126.1 kg)   SpO2 96%   BMI 34.75 kg/m  Vitals:   11/14/17 0950  BP: 112/82  Pulse: 85  Resp: 16  Temp: (!) 97.5 F (36.4 C)  TempSrc: Oral  SpO2: 96%  Weight: 278 lb (126.1 kg)     Physical Exam  Constitutional: He is oriented to person, place, and time. He appears well-developed and well-nourished. No distress.  HENT:  Head: Normocephalic and atraumatic.  Eyes: Conjunctivae are normal. No scleral icterus.  Cardiovascular: Normal rate, regular rhythm and normal heart sounds.  Pulmonary/Chest: Effort normal. No respiratory distress.  Musculoskeletal: He exhibits no edema.  0.5 cm area of epidermal loss with part of the nail bed as well of lateral left index finger tip. With bleeding  Neurological: He is alert and oriented to person, place, and time.  Skin: Skin is warm and dry. Capillary refill takes less than 2 seconds.  Psychiatric: He has a normal mood and affect. His behavior is normal.  Vitals reviewed.      Assessment & Plan:   Problem List Items Addressed This Visit      Other   Prediabetes    Patient has made great dietary changes since last visit Discussed it is too early to check his A1c again Discussed low-carb diet in great detail Discussed importance of preventing progression to diabetes      Hypercholesteremia    Recheck  lipid panel and CMP Continue simvastatin 40 mg daily presently Did not tolerate higher dose of simvastatin due to myalgias Goal LDL less than 100 Patient is concerned that his recent dietary changes may have increased his lipids Encouraged exercise Could consider atorvastatin if LDL is above goal      Relevant Orders   Lipid panel   Comprehensive metabolic panel   Laceration of left index finger without foreign body - Primary    - unable to obtain hemostasis with pressure and silver nitrate - Dr. Bary Castilla agrees to see that patient today to help with hemostasis   The entirety of the information documented in the History of Present Illness, Review of Systems and Physical Exam were personally obtained by me. Portions of this information were initially documented by Raquel Sarna Ratchford, CMA and reviewed by me for thoroughness and accuracy.    Virginia Crews, MD, MPH [facility] 11/14/2017 12:52 PM

## 2017-11-14 NOTE — Progress Notes (Signed)
Patient ID: Michael Reyes, male   DOB: [birth year], 62 y.o.   MRN: [id number]  Chief Complaint  Patient presents with  . Other    HPI Michael Reyes is a 62 y.o. male here today for a evaluation of a cut off finger tip.  He had been chopping Swiss chard in the kitchen last night when he mistook his fingertip for the vegetable.  He wrapped it last night and had no bleeding overnight but when the dressing was removed today for physician exam the bleeding persisted.  Last tetanus shot: 4 years ago.  No present anticoagulation. HPI  History reviewed. No pertinent past medical history.  Past Surgical History:  Procedure Laterality Date  . COLONOSCOPY  2012  . FINGER SURGERY Left 01/2016   cyst removal   . KNEE SURGERY Right 12/2013  . VASECTOMY      Family History  Problem Relation Age of Onset  . Tongue cancer Mother   . Heart disease Father   . Diabetes Father   . Obesity Sister   . Cancer Sister   . Diabetes Sister   . Healthy Brother     Social History Social History   Tobacco Use  . Smoking status: Never Smoker  . Smokeless tobacco: Never Used  Substance Use Topics  . Alcohol use: Yes    Alcohol/week: 0.0 oz    Comment: Occasionally  . Drug use: No    No Known Allergies  Current Outpatient Medications  Medication Sig Dispense Refill  . allopurinol (ZYLOPRIM) 100 MG tablet Take 2 tablets by mouth  once a day as directed 180 tablet 4  . aspirin 81 MG tablet Take 81 mg by mouth daily.     Marland Kitchen GLUCOSAMINE-CHONDROIT-VIT C-MN PO Take 2 tablets by mouth daily.     . MULTIPLE VITAMIN PO Take 1 tablet by mouth daily.     . simvastatin (ZOCOR) 40 MG tablet Take 1 tablet (40 mg total) by mouth daily. 90 tablet 3  . HYDROcodone-acetaminophen (NORCO/VICODIN) 5-325 MG tablet Take 1 tablet by mouth every 4 (four) hours as needed for moderate pain. 12 tablet 0   No current facility-administered medications for this visit.     Review of Systems Review of Systems   Constitutional: Positive for diaphoresis.  Respiratory: Negative.   Cardiovascular: Negative.     Blood pressure 104/62, pulse (!) 56, resp. rate 14, height 6' (1.829 m), weight 278 lb (126.1 kg), SpO2 96 %.  Physical Exam Physical Exam  Constitutional: He is oriented to person, place, and time. He appears well-developed and well-nourished.  Musculoskeletal:       Arms: Neurological: He is alert and oriented to person, place, and time.  Skin: Skin is warm and dry.  Psychiatric: His behavior is normal.      Assessment    Dermal loss on the left index fingertip with resulting bleeding.    Plan  There is no bony exposure.  A broad-based rubber band was placed between the proximal and distal phalanx for a tourniquet effect.  The area was covered with 2% Xylocaine jelly for comfort.  Exposed tissue was treated with silver nitrate followed by Xeroform, gauze, and compressive wrap.  The patient color had markedly improved by the end of the visit.  He was provided with a prescription for Norco to use for pain if Tylenol or Aleve is not adequate.  He was encouraged to keep his hand elevated when at rest.  We will have him return tomorrow  for dressing change.  HPI, Physical Exam, Assessment and Plan have been scribed under the direction and in the presence of Robert Bellow, MD. [name], RN  I have completed the exam and reviewed the above documentation for accuracy and completeness.  I agree with the above.  Haematologist has been used and any errors in dictation or transcription are unintentional.  [name], M.D., F.A.C.S.   [name] 11/14/2017, 11:20 AM  Follow up tomorrow

## 2017-11-15 ENCOUNTER — Ambulatory Visit (INDEPENDENT_AMBULATORY_CARE_PROVIDER_SITE_OTHER): Payer: BLUE CROSS/BLUE SHIELD | Admitting: General Surgery

## 2017-11-15 VITALS — BP 122/68 | HR 60 | Resp 13 | Ht 75.0 in | Wt 278.0 lb

## 2017-11-15 DIAGNOSIS — S61211A Laceration without foreign body of left index finger without damage to nail, initial encounter: Secondary | ICD-10-CM

## 2017-11-15 NOTE — Patient Instructions (Signed)
Patient has been instructed to make use of Neosporin ointment to the area after daily showers.The patient is aware to call back for any questions or concerns. Return as needed

## 2017-11-15 NOTE — Progress Notes (Signed)
Patient ID: Michael Reyes, male   DOB: [birth year], 62 y.o.   MRN: [id number]  Chief Complaint  Patient presents with  . Follow-up    HPI Michael Reyes is a 62 y.o. male here today for his one day follow up finger laceration.  Majority of the patient's pain has been around the area of the PIP joint, distal to the site of rubber band application for hemostasis during yesterday's procedure.  Sleepiness noted with the use of hydrocodone. Examination of the fingertip shows the HPI  No past medical history on file.  Past Surgical History:  Procedure Laterality Date  . COLONOSCOPY  2012  . FINGER SURGERY Left 01/2016   cyst removal   . KNEE SURGERY Right 12/2013  . VASECTOMY      Family History  Problem Relation Age of Onset  . Tongue cancer Mother   . Heart disease Father   . Diabetes Father   . Obesity Sister   . Cancer Sister   . Diabetes Sister   . Healthy Brother     Social History Social History   Tobacco Use  . Smoking status: Never Smoker  . Smokeless tobacco: Never Used  Substance Use Topics  . Alcohol use: Yes    Alcohol/week: 0.0 oz    Comment: Occasionally  . Drug use: No    No Known Allergies  Current Outpatient Medications  Medication Sig Dispense Refill  . allopurinol (ZYLOPRIM) 100 MG tablet Take 2 tablets by mouth  once a day as directed 180 tablet 4  . aspirin 81 MG tablet Take 81 mg by mouth daily.     Marland Kitchen GLUCOSAMINE-CHONDROIT-VIT C-MN PO Take 2 tablets by mouth daily.     Marland Kitchen HYDROcodone-acetaminophen (NORCO/VICODIN) 5-325 MG tablet Take 1 tablet by mouth every 4 (four) hours as needed for moderate pain. 12 tablet 0  . MULTIPLE VITAMIN PO Take 1 tablet by mouth daily.     . simvastatin (ZOCOR) 40 MG tablet Take 1 tablet (40 mg total) by mouth daily. 90 tablet 3   No current facility-administered medications for this visit.     Review of Systems Review of Systems  Constitutional: Negative.   Respiratory: Negative.   Cardiovascular:  Negative.     Blood pressure 122/68, pulse 60, resp. rate 13, height 6\' 3"  (1.905 m), weight 278 lb (126.1 kg).  Physical Exam Physical Exam Previously cauterized base to be clean without evidence of new bleeding.  Neosporin and a dry dressing was applied.  Inspection of the digit showed no swelling or evidence of a tight bandage.  Normal mobility of both the PIP and DIP joints.  Normal skin coloration.     Assessment    Wound of the index finger with loss of part of the nail bed.  Improving.    Plan  Patient has been instructed to make use of Neosporin ointment to the area after daily showers.The patient is aware to call back for any questions or concerns. Return as needed.    HPI, Physical Exam, Assessment and Plan have been scribed under the direction and in the presence of [name], MD.  Gaspar Cola, CMA  I have completed the exam and reviewed the above documentation for accuracy and completeness.  I agree with the above.  [employer] has been used and any errors in dictation or transcription are unintentional.  [name], M.D., F.A.C.S.  11/15/2017, 8:53 PM

## 2017-11-22 ENCOUNTER — Ambulatory Visit (INDEPENDENT_AMBULATORY_CARE_PROVIDER_SITE_OTHER): Payer: BLUE CROSS/BLUE SHIELD | Admitting: *Deleted

## 2017-11-22 DIAGNOSIS — S61211A Laceration without foreign body of left index finger without damage to nail, initial encounter: Secondary | ICD-10-CM

## 2017-11-22 NOTE — Progress Notes (Signed)
Patient ID: Michael Reyes, male   DOB: [birth year], 62 y.o.   MRN: [id number]   Here for wound check. He states the tip of the finger turned red about 2 days ago. He states it still hurts. Dr Bary Castilla aware, no need for intervention at this time.Continue triple antibiotic ointment.The patient is aware to call back for any questions or concerns.

## 2017-11-22 NOTE — Patient Instructions (Signed)
The patient is aware to call back for any questions or concerns.  

## 2017-12-20 DIAGNOSIS — I2510 Atherosclerotic heart disease of native coronary artery without angina pectoris: Secondary | ICD-10-CM | POA: Diagnosis not present

## 2017-12-20 DIAGNOSIS — I38 Endocarditis, valve unspecified: Secondary | ICD-10-CM | POA: Diagnosis not present

## 2017-12-20 DIAGNOSIS — E782 Mixed hyperlipidemia: Secondary | ICD-10-CM | POA: Diagnosis not present

## 2017-12-20 DIAGNOSIS — G4733 Obstructive sleep apnea (adult) (pediatric): Secondary | ICD-10-CM | POA: Diagnosis not present

## 2018-02-25 ENCOUNTER — Encounter: Payer: Self-pay | Admitting: Family Medicine

## 2018-02-25 DIAGNOSIS — G4733 Obstructive sleep apnea (adult) (pediatric): Secondary | ICD-10-CM

## 2018-02-25 DIAGNOSIS — E038 Other specified hypothyroidism: Secondary | ICD-10-CM

## 2018-02-25 DIAGNOSIS — E7800 Pure hypercholesterolemia, unspecified: Secondary | ICD-10-CM

## 2018-02-25 DIAGNOSIS — E66812 Obesity, class 2: Secondary | ICD-10-CM

## 2018-02-25 DIAGNOSIS — R7303 Prediabetes: Secondary | ICD-10-CM

## 2018-02-25 DIAGNOSIS — E669 Obesity, unspecified: Secondary | ICD-10-CM

## 2018-02-25 DIAGNOSIS — E039 Hypothyroidism, unspecified: Secondary | ICD-10-CM

## 2018-02-25 DIAGNOSIS — Z0000 Encounter for general adult medical examination without abnormal findings: Secondary | ICD-10-CM

## 2018-02-25 DIAGNOSIS — Z6838 Body mass index (BMI) 38.0-38.9, adult: Secondary | ICD-10-CM

## 2018-03-06 ENCOUNTER — Other Ambulatory Visit: Payer: Self-pay | Admitting: Family Medicine

## 2018-03-08 DIAGNOSIS — E039 Hypothyroidism, unspecified: Secondary | ICD-10-CM | POA: Diagnosis not present

## 2018-03-08 DIAGNOSIS — Z0000 Encounter for general adult medical examination without abnormal findings: Secondary | ICD-10-CM | POA: Diagnosis not present

## 2018-03-08 DIAGNOSIS — R7303 Prediabetes: Secondary | ICD-10-CM | POA: Diagnosis not present

## 2018-03-08 DIAGNOSIS — E669 Obesity, unspecified: Secondary | ICD-10-CM | POA: Diagnosis not present

## 2018-03-08 DIAGNOSIS — E7800 Pure hypercholesterolemia, unspecified: Secondary | ICD-10-CM | POA: Diagnosis not present

## 2018-03-08 DIAGNOSIS — R7989 Other specified abnormal findings of blood chemistry: Secondary | ICD-10-CM | POA: Diagnosis not present

## 2018-03-08 DIAGNOSIS — Z6838 Body mass index (BMI) 38.0-38.9, adult: Secondary | ICD-10-CM | POA: Diagnosis not present

## 2018-03-09 LAB — CBC WITH DIFFERENTIAL/PLATELET
BASOS: 1 %
Basophils Absolute: 0 10*3/uL (ref 0.0–0.2)
EOS (ABSOLUTE): 0.2 10*3/uL (ref 0.0–0.4)
EOS: 3 %
HEMATOCRIT: 44.9 % (ref 37.5–51.0)
Hemoglobin: 15.3 g/dL (ref 13.0–17.7)
IMMATURE GRANS (ABS): 0 10*3/uL (ref 0.0–0.1)
IMMATURE GRANULOCYTES: 0 %
LYMPHS: 26 %
Lymphocytes Absolute: 1.6 10*3/uL (ref 0.7–3.1)
MCH: 32.4 pg (ref 26.6–33.0)
MCHC: 34.1 g/dL (ref 31.5–35.7)
MCV: 95 fL (ref 79–97)
MONOS ABS: 0.9 10*3/uL (ref 0.1–0.9)
Monocytes: 14 %
NEUTROS PCT: 56 %
Neutrophils Absolute: 3.4 10*3/uL (ref 1.4–7.0)
PLATELETS: 267 10*3/uL (ref 150–450)
RBC: 4.72 x10E6/uL (ref 4.14–5.80)
RDW: 12.4 % (ref 12.3–15.4)
WBC: 6.1 10*3/uL (ref 3.4–10.8)

## 2018-03-09 LAB — COMPREHENSIVE METABOLIC PANEL
ALT: 40 IU/L (ref 0–44)
AST: 31 IU/L (ref 0–40)
Albumin/Globulin Ratio: 1.6 (ref 1.2–2.2)
Albumin: 4.3 g/dL (ref 3.6–4.8)
Alkaline Phosphatase: 114 IU/L (ref 39–117)
BUN/Creatinine Ratio: 14 (ref 10–24)
BUN: 16 mg/dL (ref 8–27)
Bilirubin Total: 0.4 mg/dL (ref 0.0–1.2)
CALCIUM: 9.5 mg/dL (ref 8.6–10.2)
CO2: 24 mmol/L (ref 20–29)
CREATININE: 1.13 mg/dL (ref 0.76–1.27)
Chloride: 105 mmol/L (ref 96–106)
GFR, EST AFRICAN AMERICAN: 80 mL/min/{1.73_m2} (ref >59)
GFR, EST NON AFRICAN AMERICAN: 69 mL/min/{1.73_m2} (ref >59)
Globulin, Total: 2.7 g/dL (ref 1.5–4.5)
Glucose: 84 mg/dL (ref 65–99)
Potassium: 4.4 mmol/L (ref 3.5–5.2)
Sodium: 145 mmol/L — ABNORMAL HIGH (ref 134–144)
TOTAL PROTEIN: 7 g/dL (ref 6.0–8.5)

## 2018-03-09 LAB — LIPID PANEL
CHOL/HDL RATIO: 2.9 ratio (ref 0.0–5.0)
CHOLESTEROL TOTAL: 114 mg/dL (ref 100–199)
HDL: 39 mg/dL — AB (ref >39)
LDL Calculated: 61 mg/dL (ref 0–99)
TRIGLYCERIDES: 72 mg/dL (ref 0–149)
VLDL Cholesterol Cal: 14 mg/dL (ref 5–40)

## 2018-03-09 LAB — TSH: TSH: 4.68 u[IU]/mL — ABNORMAL HIGH (ref 0.450–4.500)

## 2018-03-09 LAB — HEMOGLOBIN A1C
Est. average glucose Bld gHb Est-mCnc: 114 mg/dL
Hgb A1c MFr Bld: 5.6 % (ref 4.8–5.6)

## 2018-03-11 ENCOUNTER — Ambulatory Visit (INDEPENDENT_AMBULATORY_CARE_PROVIDER_SITE_OTHER): Payer: BLUE CROSS/BLUE SHIELD | Admitting: Family Medicine

## 2018-03-11 ENCOUNTER — Encounter: Payer: Self-pay | Admitting: Family Medicine

## 2018-03-11 VITALS — BP 122/73 | HR 78 | Temp 98.3°F | Ht 75.0 in | Wt 255.4 lb

## 2018-03-11 DIAGNOSIS — Z23 Encounter for immunization: Secondary | ICD-10-CM | POA: Diagnosis not present

## 2018-03-11 DIAGNOSIS — R7303 Prediabetes: Secondary | ICD-10-CM | POA: Diagnosis not present

## 2018-03-11 DIAGNOSIS — Z20828 Contact with and (suspected) exposure to other viral communicable diseases: Secondary | ICD-10-CM

## 2018-03-11 DIAGNOSIS — R053 Chronic cough: Secondary | ICD-10-CM

## 2018-03-11 DIAGNOSIS — G4733 Obstructive sleep apnea (adult) (pediatric): Secondary | ICD-10-CM | POA: Diagnosis not present

## 2018-03-11 DIAGNOSIS — E039 Hypothyroidism, unspecified: Secondary | ICD-10-CM

## 2018-03-11 DIAGNOSIS — Z0000 Encounter for general adult medical examination without abnormal findings: Secondary | ICD-10-CM

## 2018-03-11 DIAGNOSIS — E7800 Pure hypercholesterolemia, unspecified: Secondary | ICD-10-CM | POA: Diagnosis not present

## 2018-03-11 DIAGNOSIS — R05 Cough: Secondary | ICD-10-CM

## 2018-03-11 DIAGNOSIS — E669 Obesity, unspecified: Secondary | ICD-10-CM

## 2018-03-11 DIAGNOSIS — Z6831 Body mass index (BMI) 31.0-31.9, adult: Secondary | ICD-10-CM

## 2018-03-11 NOTE — Progress Notes (Signed)
Patient: Michael Reyes, Male    DOB: 1955/05/15, 62 y.o.   MRN: [id number] Visit Date: 03/12/2018  Today's Provider: Lavon Paganini, MD   Chief Complaint  Patient presents with  . Annual Exam   Subjective:  I, Michael Reyes, CMA, am acting as a scribe for Lavon Paganini, MD.    Annual physical exam Michael Reyes is a 62 y.o. male who presents today for health maintenance and complete physical. He feels well. He reports exercising some. He reports he is sleeping well.  Patient has been working hard on weight loss and control of his prediabetes and cholesterol.  He has been on a strict keto diet.  He has been exercising some.  He has lost about 70 pounds.  He states that he has been feeling fatigued and very hungry.  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  There has been a mumps outbreak and is been recommended everyone get an MMR vaccine update regardless of immunity status.  He has not gotten this vaccination yet Patient is also interested in Shingrix vaccination.  His wife has gotten the series and recommended that he do the same  Patient had labs drawn last week which were reviewed with patient.  He is concerned about chronic cough that has persisted for about 1 month.  Seem to start with URI.  He is not producing any sputum.  He was previously treated for allergic rhinitis with allergy shots for several years.  He has not had allergy symptoms since then.  He denies any reflux symptoms.  States he is using CPAP regularly and has been for many years.  Since recent weight loss, he has struggled with waking up feeling that he is drowning and pressure is too high.  He did send Autopap chip to company for interrogation.  -----------------------------------------------------------------   Review of Systems  Constitutional: Negative.   HENT: Positive for sore throat.     Eyes: Negative.   Respiratory: Positive for apnea and cough.   Cardiovascular: Negative.   Gastrointestinal: Negative.   Endocrine: Negative.   Genitourinary: Negative.   Musculoskeletal: Negative.   Skin: Negative.   Allergic/Immunologic: Negative.   Neurological: Negative.   Hematological: Negative.   Psychiatric/Behavioral: Negative.     Social History      He  reports that he has never smoked. He has never used smokeless tobacco. He reports that he drinks alcohol. He reports that he does not use drugs.       Social History   Socioeconomic History  . Marital status: Married    Spouse name: Not on file  . Number of children: 2  . Years of education: PHD  . Highest education level: Not on file  Occupational History  . Occupation: Professor  Social Needs  . Financial resource strain: Not on file  . Food insecurity:    Worry: Not on file    Inability: Not on file  . Transportation needs:    Medical: Not on file    Non-medical: Not on file  Tobacco Use  . Smoking status: Never Smoker  . Smokeless tobacco: Never Used  Substance and Sexual Activity  . Alcohol use: Yes    Alcohol/week: 0.0 standard drinks    Comment: Occasionally  . Drug use: No  . Sexual activity: Yes  Lifestyle  . Physical activity:    Days per week: 1 day  Minutes per session: 120 min  . Stress: Not on file  Relationships  . Social connections:    Talks on phone: Not on file    Gets together: Not on file    Attends religious service: Not on file    Active member of club or organization: Not on file    Attends meetings of clubs or organizations: Not on file    Relationship status: Not on file  Other Topics Concern  . Not on file  Social History Narrative  . Not on file    No past medical history on file.   Patient Active Problem List   Diagnosis Date Noted  . Rectal bleeding 03/06/2017  . Dribbling following urination 12/08/2016  . Ganglion cyst of finger of left hand 12/08/2016   . History of adenomatous polyp of colon 02/24/2016  . Obesity 02/24/2016  . Allergic rhinitis 10/01/2015  . Chronic cough 10/01/2015  . Venous insufficiency of leg 07/27/2015  . Anxiety 04/02/2015  . Arteriosclerosis of coronary artery 04/02/2015  . Dermatitis, eczematoid 04/02/2015  . Prediabetes 04/02/2015  . Hypercholesteremia 04/02/2015  . Disorder of mitral valve 04/02/2015  . Hypothyroidism 04/02/2015  . Heart valve disease 04/02/2015  . OSA (obstructive sleep apnea) 03/30/2014  . Arthritis due to gout 05/08/2008  . ED (erectile dysfunction) of organic origin 05/05/2008    Past Surgical History:  Procedure Laterality Date  . COLONOSCOPY  2012  . FINGER SURGERY Left 01/2016   cyst removal   . KNEE SURGERY Right 12/2013  . VASECTOMY      Family History        Family Status  Relation Name Status  . Mother  Deceased  . Father  Deceased at age 36's  . Sister  Deceased  . Brother  Alive        His family history includes Cancer in his sister; Diabetes in his father and sister; Healthy in his brother; Heart disease in his father; Obesity in his sister; Tongue cancer in his mother.      No Known Allergies   Current Outpatient Medications:  .  allopurinol (ZYLOPRIM) 100 MG tablet, TAKE 2 TABLETS BY MOUTH DAILY AS DIRECTED, Disp: 180 tablet, Rfl: 4 .  aspirin 81 MG tablet, Take 81 mg by mouth daily. , Disp: , Rfl:  .  GLUCOSAMINE-CHONDROIT-VIT C-MN PO, Take 2 tablets by mouth daily. , Disp: , Rfl:  .  HYDROcodone-acetaminophen (NORCO/VICODIN) 5-325 MG tablet, Take 1 tablet by mouth every 4 (four) hours as needed for moderate pain., Disp: 12 tablet, Rfl: 0 .  levothyroxine (SYNTHROID, LEVOTHROID) 50 MCG tablet, Take 1 tablet (50 mcg total) by mouth daily., Disp: 90 tablet, Rfl: 3 .  MULTIPLE VITAMIN PO, Take 1 tablet by mouth daily. , Disp: , Rfl:  .  simvastatin (ZOCOR) 40 MG tablet, Take 1 tablet (40 mg total) by mouth daily., Disp: 90 tablet, Rfl: 3   Patient Care  Team: Virginia Crews, MD as PCP - General (Family Medicine)      Objective:   Vitals: BP 122/73 (BP Location: Left Arm, Patient Position: Sitting, Cuff Size: Large)   Pulse 78   Temp 98.3 F (36.8 C) (Oral)   Ht '6\' 3"'$  (1.905 m)   Wt 255 lb 6.4 oz (115.8 kg)   SpO2 95%   BMI 31.92 kg/m    Vitals:   03/11/18 1609  BP: 122/73  Pulse: 78  Temp: 98.3 F (36.8 C)  TempSrc: Oral  SpO2: 95%  Weight:  255 lb 6.4 oz (115.8 kg)  Height: _0  (1.905 m)     Physical Exam  Constitutional: He is oriented to person, place, and time. He appears well-developed and well-nourished. No distress.  HENT:  Head: Normocephalic and atraumatic.  Right Ear: External ear normal.  Left Ear: External ear normal.  Nose: Nose normal.  Mouth/Throat: Oropharynx is clear and moist.  Eyes: Pupils are equal, round, and reactive to light. Conjunctivae and EOM are normal. No scleral icterus.  Neck: Neck supple. No thyromegaly present.  Cardiovascular: Normal rate, regular rhythm, normal heart sounds and intact distal pulses.  No murmur heard. Pulmonary/Chest: Effort normal and breath sounds normal. No respiratory distress. He has no wheezes. He has no rales.  Abdominal: Soft. Bowel sounds are normal. He exhibits no distension. There is no tenderness. There is no rebound and no guarding.  Musculoskeletal: He exhibits no edema or deformity.  Lymphadenopathy:    He has no cervical adenopathy.  Neurological: He is alert and oriented to person, place, and time.  Skin: Skin is warm and dry. Capillary refill takes less than 2 seconds. No rash noted.  Psychiatric: He has a normal mood and affect. His behavior is normal.  Vitals reviewed.    Depression Screen PHQ 2/9 Scores 03/11/2018 03/06/2017 02/24/2016  PHQ - 2 Score 0 0 0  PHQ- 9 Score 0 - 0     Assessment & Plan:     Routine Health Maintenance and Physical Exam  Exercise Activities and Dietary recommendations Goals   None      Immunization History  Administered Date(s) Administered  . Hepatitis A 04/06/2010, 12/09/2010  . Hepatitis B, adult 03/28/2016, 05/16/2016, 09/27/2016  . Influenza-Unspecified 01/30/2015, 01/09/2017, 02/19/2018  . MMR 03/11/2018  . Tdap 11/18/2013  . Zoster Recombinat (Shingrix) 03/11/2018    Health Maintenance  Topic Date Due  . HIV Screening  03/26/1969  . COLONOSCOPY  04/18/2022  . TETANUS/TDAP  11/19/2023  . INFLUENZA VACCINE  Completed  . Hepatitis C Screening  Completed     Discussed health benefits of physical activity, and encouraged him to engage in regular exercise appropriate for his age and condition.    --------------------------------------------------------------------  Problem List Items Addressed This Visit      Respiratory   OSA (obstructive sleep apnea)    Previously well controlled Has been on AutoPap since 2012 With recent weight loss, it is possible he needs mask or pressure adjustment We will repeat sleep study for titration Is also possible that given his significant weight loss, he may not need CPAP anymore      Relevant Orders   Ambulatory referral to Sleep Studies     Endocrine   Hypothyroidism    Previously with subclinical hypothyroidism Now with high TSH and low free T4 Will treat with Synthroid 50 mcg daily  Recheck TSH in 6-8 weeks for possible dose titration      Relevant Medications   levothyroxine (SYNTHROID, LEVOTHROID) 50 MCG tablet     Other   Prediabetes    Now with normal A1c Congratulated on weight loss Will recheck A1c in 3-6 months to ensure stays normal as slowly adding carbs back into diet      Hypercholesteremia    Improved significantly with diet weight loss Continue simvastatin at current dose Encouraged exercise      Chronic cough    Lungs are clear on exam Discussed with patient that this is either related to allergic rhinitis or GERD He will try daily antihistamine  to see if this helps We could  consider H2-blocker trial in the future Not on any medications that would be causing this      Obesity    Congratulated patient on his significant weight loss and decrease in his BMI Encouraged ongoing exercise and diet       Other Visit Diagnoses    Encounter for annual physical exam    -  Primary   Need for MMR vaccine       Relevant Orders   MMR vaccine subcutaneous (Completed)   Exposure to mumps virus       Relevant Orders   MMR vaccine subcutaneous (Completed)   Need for shingles vaccine       Relevant Orders   Varicella-zoster vaccine IM (Shingrix) (Completed)       Return in about 3 months (around 06/11/2018) for blood sugar follow-up and 2nd Shingrix.   The entirety of the information documented in the History of Present Illness, Review of Systems and Physical Exam were personally obtained by me. Portions of this information were initially documented by Michael Reyes, CMA and reviewed by me for thoroughness and accuracy.    Virginia Crews, MD, MPH [facility] 03/12/2018 10:13 AM

## 2018-03-11 NOTE — Patient Instructions (Addendum)
Cetirizine (Zyrtec) or Loratidine (Claritin) daily for allergies - ? Cause of cough  Cough is likely from allergies or reflux   Preventive Care 40-64 Years, Male Preventive care refers to lifestyle choices and visits with your health care provider that can promote health and wellness. What does preventive care include?  A yearly physical exam. This is also called an annual well check.  Dental exams once or twice a year.  Routine eye exams. Ask your health care provider how often you should have your eyes checked.  Personal lifestyle choices, including: ? Daily care of your teeth and gums. ? Regular physical activity. ? Eating a healthy diet. ? Avoiding tobacco and drug use. ? Limiting alcohol use. ? Practicing safe sex. ? Taking low-dose aspirin every day starting at age 6. What happens during an annual well check? The services and screenings done by your health care provider during your annual well check will depend on your age, overall health, lifestyle risk factors, and family history of disease. Counseling Your health care provider may ask you questions about your:  Alcohol use.  Tobacco use.  Drug use.  Emotional well-being.  Home and relationship well-being.  Sexual activity.  Eating habits.  Work and work Statistician.  Screening You may have the following tests or measurements:  Height, weight, and BMI.  Blood pressure.  Lipid and cholesterol levels. These may be checked every 5 years, or more frequently if you are over 64 years old.  Skin check.  Lung cancer screening. You may have this screening every year starting at age 64 if you have a 30-pack-year history of smoking and currently smoke or have quit within the past 15 years.  Fecal occult blood test (FOBT) of the stool. You may have this test every year starting at age 64.  Flexible sigmoidoscopy or colonoscopy. You may have a sigmoidoscopy every 5 years or a colonoscopy every 10 years starting  at age 64.  Prostate cancer screening. Recommendations will vary depending on your family history and other risks.  Hepatitis C blood test.  Hepatitis B blood test.  Sexually transmitted disease (STD) testing.  Diabetes screening. This is done by checking your blood sugar (glucose) after you have not eaten for a while (fasting). You may have this done every 1-3 years.  Discuss your test results, treatment options, and if necessary, the need for more tests with your health care provider. Vaccines Your health care provider may recommend certain vaccines, such as:  Influenza vaccine. This is recommended every year.  Tetanus, diphtheria, and acellular pertussis (Tdap, Td) vaccine. You may need a Td booster every 10 years.  Varicella vaccine. You may need this if you have not been vaccinated.  Zoster vaccine. You may need this after age 64.  Measles, mumps, and rubella (MMR) vaccine. You may need at least one dose of MMR if you were born in 1957 or later. You may also need a second dose.  Pneumococcal 13-valent conjugate (PCV13) vaccine. You may need this if you have certain conditions and have not been vaccinated.  Pneumococcal polysaccharide (PPSV23) vaccine. You may need one or two doses if you smoke cigarettes or if you have certain conditions.  Meningococcal vaccine. You may need this if you have certain conditions.  Hepatitis A vaccine. You may need this if you have certain conditions or if you travel or work in places where you may be exposed to hepatitis A.  Hepatitis B vaccine. You may need this if you have certain conditions  or if you travel or work in places where you may be exposed to hepatitis B.  Haemophilus influenzae type b (Hib) vaccine. You may need this if you have certain risk factors.  Talk to your health care provider about which screenings and vaccines you need and how often you need them. This information is not intended to replace advice given to you by your  health care provider. Make sure you discuss any questions you have with your health care provider. Document Released: 05/14/2015 Document Revised: 01/05/2016 Document Reviewed: 02/16/2015 Elsevier Interactive Patient Education  [employer].

## 2018-03-12 ENCOUNTER — Telehealth: Payer: Self-pay

## 2018-03-12 LAB — T4, FREE: FREE T4: 0.77 ng/dL — AB (ref 0.82–1.77)

## 2018-03-12 LAB — SPECIMEN STATUS REPORT

## 2018-03-12 MED ORDER — LEVOTHYROXINE SODIUM 50 MCG PO TABS: 50.0000 ug | ORAL_TABLET | Freq: Every day | ORAL | 3 refills | Status: DC

## 2018-03-12 MED ORDER — LEVOTHYROXINE SODIUM 50 MCG PO TABS: 50.0000 ug | ORAL_TABLET | Freq: Every day | ORAL | 0 refills | Status: DC

## 2018-03-12 NOTE — Assessment & Plan Note (Signed)
Previously with subclinical hypothyroidism Now with high TSH and low free T4 Will treat with Synthroid 50 mcg daily  Recheck TSH in 6-8 weeks for possible dose titration

## 2018-03-12 NOTE — Assessment & Plan Note (Signed)
Lungs are clear on exam Discussed with patient that this is either related to allergic rhinitis or GERD He will try daily antihistamine to see if this helps We could consider H2-blocker trial in the future Not on any medications that would be causing this

## 2018-03-12 NOTE — Telephone Encounter (Signed)
-----   Message from Virginia Crews, MD sent at 03/12/2018  8:25 AM EST ----- Free T4 (thyroid hormone) is low.  This means you have hypothyroidism.  Would start Synthroid 50 mcg daily and recheck TSH in ~6-8 weeks. This may have been impeding weight loss some and can cause fatigue.   Virginia Crews, MD, MPH [facility] 03/12/2018 8:25 AM

## 2018-03-12 NOTE — Telephone Encounter (Signed)
Patient advised.

## 2018-03-12 NOTE — Assessment & Plan Note (Signed)
Improved significantly with diet weight loss Continue simvastatin at current dose Encouraged exercise

## 2018-03-12 NOTE — Telephone Encounter (Signed)
Patient was advised and would like for the medication to be send to the [facility] [phone] pharmacy for long term use. Patient states if it is something that he needs immediately then you could send one prescription to [facility].

## 2018-03-12 NOTE — Telephone Encounter (Signed)
One month supply sent to [facility] and 3 month supply sent to mail order pharmacy  Bacigalupo, Dionne Bucy, MD, MPH [facility] 03/12/2018 9:39 AM

## 2018-03-12 NOTE — Assessment & Plan Note (Signed)
Now with normal A1c Congratulated on weight loss Will recheck A1c in 3-6 months to ensure stays normal as slowly adding carbs back into diet

## 2018-03-12 NOTE — Telephone Encounter (Signed)
Patient was advised and would like for the medication to be send to the [facility] [phone] pharmacy for long term use. Patient states if it is something that he needs immediately then you could send one prescription to [facility].

## 2018-03-12 NOTE — Assessment & Plan Note (Signed)
Congratulated patient on his significant weight loss and decrease in his BMI Encouraged ongoing exercise and diet

## 2018-03-12 NOTE — Telephone Encounter (Signed)
-----   Message from Virginia Crews, MD sent at 03/12/2018  8:25 AM EST ----- Free T4 (thyroid hormone) is low.  This means you have hypothyroidism.  Would start Synthroid 50 mcg daily and recheck TSH in ~6-8 weeks. This may have been impeding weight loss some and can cause fatigue.   Virginia Crews, MD, MPH [facility] 03/12/2018 8:25 AM

## 2018-03-12 NOTE — Assessment & Plan Note (Signed)
Previously well controlled Has been on AutoPap since 2012 With recent weight loss, it is possible he needs mask or pressure adjustment We will repeat sleep study for titration Is also possible that given his significant weight loss, he may not need CPAP anymore

## 2018-03-25 DIAGNOSIS — R0602 Shortness of breath: Secondary | ICD-10-CM | POA: Diagnosis not present

## 2018-03-25 DIAGNOSIS — G4733 Obstructive sleep apnea (adult) (pediatric): Secondary | ICD-10-CM | POA: Diagnosis not present

## 2018-03-26 DIAGNOSIS — G4733 Obstructive sleep apnea (adult) (pediatric): Secondary | ICD-10-CM | POA: Diagnosis not present

## 2018-03-26 DIAGNOSIS — R0602 Shortness of breath: Secondary | ICD-10-CM | POA: Diagnosis not present

## 2018-05-29 ENCOUNTER — Telehealth: Payer: Self-pay

## 2018-05-29 ENCOUNTER — Other Ambulatory Visit: Payer: Self-pay | Admitting: Family Medicine

## 2018-05-29 DIAGNOSIS — E039 Hypothyroidism, unspecified: Secondary | ICD-10-CM

## 2018-05-29 MED ORDER — SILDENAFIL CITRATE 20 MG PO TABS: 60.0000 mg | ORAL_TABLET | Freq: Every day | ORAL | 2 refills | Status: DC | PRN

## 2018-05-29 NOTE — Telephone Encounter (Signed)
OK to leave order for TSH at front desk and let him come at his convenience for lab draw.

## 2018-05-29 NOTE — Telephone Encounter (Signed)
Lab slip printed at the front desk for pickup. Patient advised.

## 2018-05-29 NOTE — Telephone Encounter (Signed)
Patient calling that he needs his TSH recheck. Please call patient when ready.

## 2018-05-30 LAB — TSH: TSH: 2.72 u[IU]/mL (ref 0.450–4.500)

## 2018-06-11 ENCOUNTER — Ambulatory Visit: Payer: BLUE CROSS/BLUE SHIELD | Admitting: Family Medicine

## 2018-06-11 ENCOUNTER — Encounter: Payer: Self-pay | Admitting: Family Medicine

## 2018-06-11 VITALS — BP 127/75 | HR 75 | Temp 98.2°F | Wt 258.2 lb

## 2018-06-11 DIAGNOSIS — Z23 Encounter for immunization: Secondary | ICD-10-CM | POA: Diagnosis not present

## 2018-06-11 DIAGNOSIS — E039 Hypothyroidism, unspecified: Secondary | ICD-10-CM

## 2018-06-11 DIAGNOSIS — R7303 Prediabetes: Secondary | ICD-10-CM

## 2018-06-11 DIAGNOSIS — G4733 Obstructive sleep apnea (adult) (pediatric): Secondary | ICD-10-CM | POA: Diagnosis not present

## 2018-06-11 DIAGNOSIS — E669 Obesity, unspecified: Secondary | ICD-10-CM | POA: Diagnosis not present

## 2018-06-11 DIAGNOSIS — Z6832 Body mass index (BMI) 32.0-32.9, adult: Secondary | ICD-10-CM

## 2018-06-11 LAB — POCT GLYCOSYLATED HEMOGLOBIN (HGB A1C): Hemoglobin A1C: 5.6 % (ref 4.0–5.6)

## 2018-06-11 NOTE — Progress Notes (Signed)
Patient: Michael Reyes Male    DOB: 10-09-1955   63 y.o.   MRN: [id number] Visit Date: 06/11/2018  Today's Provider: Lavon Paganini, MD   Chief Complaint  Patient presents with  . Pre-Diabetic Follow Up   Subjective: I, [name], CMA, am acting as a scribe for Lavon Paganini, MD.     HPI  Prediabetes, Follow-up:   Lab Results  Component Value Date   HGBA1C 5.6 03/08/2018   HGBA1C 6.2 09/06/2017   HGBA1C 5.9 (H) 12/08/2016   GLUCOSE 84 03/08/2018   GLUCOSE 123 (H) 12/08/2016   GLUCOSE 113 (H) 04/20/2015    Last seen for for this3 months ago.  Management since that visit includes continue diet and weight loss. Current symptoms include none   Weight trend: decreasing steadily Prior visit with dietician: no Current diet: keto diet Current exercise: light exercise lately, just walking around campus  Pertinent Labs:    Component Value Date/Time   CHOL 114 03/08/2018 0812   TRIG 72 03/08/2018 0812   CHOLHDL 2.9 03/08/2018 0812   CREATININE 1.13 03/08/2018 0812    Wt Readings from Last 3 Encounters:  06/11/18 258 lb 3.2 oz (117.1 kg)  03/11/18 255 lb 6.4 oz (115.8 kg)  11/15/17 278 lb (126.1 kg)    Hypothyroidism: Taking Synthroid 28mcg daily with good compliance.  Asymptomatic.  Continues to follow Keto diet  OSA: Brings compliance CPAP report.  Has great compliance.  Pressures range from 5 to 20 mmHg.  Leak is high.  No Known Allergies   Current Outpatient Medications:  .  allopurinol (ZYLOPRIM) 100 MG tablet, TAKE 2 TABLETS BY MOUTH DAILY AS DIRECTED, Disp: 180 tablet, Rfl: 4 .  aspirin 81 MG tablet, Take 81 mg by mouth daily. , Disp: , Rfl:  .  GLUCOSAMINE-CHONDROIT-VIT C-MN PO, Take 2 tablets by mouth daily. , Disp: , Rfl:  .  levothyroxine (SYNTHROID, LEVOTHROID) 50 MCG tablet, Take 1 tablet (50 mcg total) by mouth daily., Disp: 90 tablet, Rfl: 3 .  MULTIPLE VITAMIN PO, Take 1 tablet by mouth daily. , Disp: , Rfl:  .  sildenafil  (REVATIO) 20 MG tablet, Take 3-5 tablets (60-100 mg total) by mouth daily as needed., Disp: 30 tablet, Rfl: 2 .  simvastatin (ZOCOR) 40 MG tablet, Take 1 tablet (40 mg total) by mouth daily., Disp: 90 tablet, Rfl: 3  Review of Systems  Constitutional: Negative.   HENT: Negative.   Respiratory: Negative.   Cardiovascular: Negative.   Neurological: Negative.   Psychiatric/Behavioral: Negative.     Social History   Tobacco Use  . Smoking status: Never Smoker  . Smokeless tobacco: Never Used  Substance Use Topics  . Alcohol use: Yes    Alcohol/week: 0.0 standard drinks    Comment: Occasionally      Objective:   BP 127/75 (BP Location: Right Arm, Patient Position: Sitting, Cuff Size: Normal)   Pulse 75   Temp 98.2 F (36.8 C) (Oral)   Wt 258 lb 3.2 oz (117.1 kg)   SpO2 93%   BMI 32.27 kg/m  Vitals:   06/11/18 0959  BP: 127/75  Pulse: 75  Temp: 98.2 F (36.8 C)  TempSrc: Oral  SpO2: 93%  Weight: 258 lb 3.2 oz (117.1 kg)     Physical Exam Vitals signs reviewed.  Constitutional:      General: He is not in acute distress.    Appearance: Normal appearance. He is not diaphoretic.  HENT:     Head:  Normocephalic and atraumatic.     Mouth/Throat:     Pharynx: Oropharynx is clear.  Eyes:     General: No scleral icterus.    Conjunctiva/sclera: Conjunctivae normal.  Neck:     Musculoskeletal: Neck supple.  Cardiovascular:     Rate and Rhythm: Normal rate and regular rhythm.     Pulses: Normal pulses.     Heart sounds: Normal heart sounds. No murmur.  Pulmonary:     Effort: Pulmonary effort is normal. No respiratory distress.     Breath sounds: Normal breath sounds. No wheezing or rhonchi.  Musculoskeletal:     Right lower leg: No edema.     Left lower leg: No edema.  Lymphadenopathy:     Cervical: No cervical adenopathy.  Skin:    General: Skin is warm and dry.     Capillary Refill: Capillary refill takes less than 2 seconds.     Findings: No rash.    Neurological:     Mental Status: He is alert and oriented to person, place, and time. Mental status is at baseline.  Psychiatric:        Mood and Affect: Mood normal.        Behavior: Behavior normal.     Results for orders placed or performed in visit on 06/11/18  POCT HgB A1C  Result Value Ref Range   Hemoglobin A1C 5.6 4.0 - 5.6 %   HbA1c POC (<> result, manual entry)     HbA1c, POC (prediabetic range)     HbA1c, POC (controlled diabetic range)          Assessment & Plan   Problem List Items Addressed This Visit      Respiratory   OSA (obstructive sleep apnea)    Having difficulty with CPAP pressures being too high Suspect that leak is contributing to this Plan to talk to company about mask fitting        Endocrine   Hypothyroidism    Well controlled with normal TSH Continue Synthroid at current dose        Other   Prediabetes - Primary    Now with normal A1c Continue diet and exercise      Relevant Orders   POCT HgB A1C (Completed)   Obesity    Discussed diet and exercise Encouraged ongoing weight loss          Return in about 9 months (around 03/12/2019) for CPE.   The entirety of the information documented in the History of Present Illness, Review of Systems and Physical Exam were personally obtained by me. Portions of this information were initially documented by [name], CMA and reviewed by me for thoroughness and accuracy.    Virginia Crews, MD, MPH [facility] 06/12/2018 1:48 PM

## 2018-06-11 NOTE — Patient Instructions (Signed)
Hypothyroidism  Hypothyroidism is when the thyroid gland does not make enough of certain hormones (it is underactive). The thyroid gland is a small gland located in the lower front part of the neck, just in front of the windpipe (trachea). This gland makes hormones that help control how the body uses food for energy (metabolism) as well as how the heart and brain function. These hormones also play a role in keeping your bones strong. When the thyroid is underactive, it produces too little of the hormones thyroxine (T4) and triiodothyronine (T3). What are the causes? This condition may be caused by:  Hashimoto's disease. This is a disease in which the body's disease-fighting system (immune system) attacks the thyroid gland. This is the most common cause.  Viral infections.  Pregnancy.  Certain medicines.  Birth defects.  Past radiation treatments to the head or neck for cancer.  Past treatment with radioactive iodine.  Past exposure to radiation in the environment.  Past surgical removal of part or all of the thyroid.  Problems with a gland in the center of the brain (pituitary gland).  Lack of enough iodine in the diet. What increases the risk? You are more likely to develop this condition if:  You are male.  You have a family history of thyroid conditions.  You use a medicine called lithium.  You take medicines that affect the immune system (immunosuppressants). What are the signs or symptoms? Symptoms of this condition include:  Feeling as though you have no energy (lethargy).  Not being able to tolerate cold.  Weight gain that is not explained by a change in diet or exercise habits.  Lack of appetite.  Dry skin.  Coarse hair.  Menstrual irregularity.  Slowing of thought processes.  Constipation.  Sadness or depression. How is this diagnosed? This condition may be diagnosed based on:  Your symptoms, your medical history, and a physical exam.  Blood  tests. You may also have imaging tests, such as an ultrasound or MRI. How is this treated? This condition is treated with medicine that replaces the thyroid hormones that your body does not make. After you begin treatment, it may take several weeks for symptoms to go away. Follow these instructions at home:  Take over-the-counter and prescription medicines only as told by your health care provider.  If you start taking any new medicines, tell your health care provider.  Keep all follow-up visits as told by your health care provider. This is important. ? As your condition improves, your dosage of thyroid hormone medicine may change. ? You will need to have blood tests regularly so that your health care provider can monitor your condition. Contact a health care provider if:  Your symptoms do not get better with treatment.  You are taking thyroid replacement medicine and you: ? Sweat a lot. ? Have tremors. ? Feel anxious. ? Lose weight rapidly. ? Cannot tolerate heat. ? Have emotional swings. ? Have diarrhea. ? Feel weak. Get help right away if you have:  Chest pain.  An irregular heartbeat.  A rapid heartbeat.  Difficulty breathing. Summary  Hypothyroidism is when the thyroid gland does not make enough of certain hormones (it is underactive).  When the thyroid is underactive, it produces too little of the hormones thyroxine (T4) and triiodothyronine (T3).  The most common cause is Hashimoto's disease, a disease in which the body's disease-fighting system (immune system) attacks the thyroid gland. The condition can also be caused by viral infections, medicine, pregnancy, or past   radiation treatment to the head or neck.  Symptoms may include weight gain, dry skin, constipation, feeling as though you do not have energy, and not being able to tolerate cold.  This condition is treated with medicine to replace the thyroid hormones that your body does not make. This information  is not intended to replace advice given to you by your health care provider. Make sure you discuss any questions you have with your health care provider. Document Released: 04/17/2005 Document Revised: 03/28/2017 Document Reviewed: 03/28/2017 Elsevier Interactive Patient Education  2019 Elsevier Inc[employer]  

## 2018-06-12 NOTE — Assessment & Plan Note (Signed)
Well controlled with normal TSH Continue Synthroid at current dose

## 2018-06-12 NOTE — Assessment & Plan Note (Signed)
Discussed diet and exercise Encouraged ongoing weight loss

## 2018-06-12 NOTE — Assessment & Plan Note (Signed)
Now with normal A1c Continue diet and exercise

## 2018-06-12 NOTE — Assessment & Plan Note (Signed)
Having difficulty with CPAP pressures being too high Suspect that leak is contributing to this Plan to talk to company about mask fitting

## 2018-06-25 DIAGNOSIS — G4733 Obstructive sleep apnea (adult) (pediatric): Secondary | ICD-10-CM | POA: Diagnosis not present

## 2018-06-28 DIAGNOSIS — G4733 Obstructive sleep apnea (adult) (pediatric): Secondary | ICD-10-CM | POA: Diagnosis not present

## 2018-08-26 ENCOUNTER — Other Ambulatory Visit: Payer: Self-pay | Admitting: Family Medicine

## 2018-08-26 DIAGNOSIS — E7800 Pure hypercholesterolemia, unspecified: Secondary | ICD-10-CM

## 2018-09-26 DIAGNOSIS — G4733 Obstructive sleep apnea (adult) (pediatric): Secondary | ICD-10-CM | POA: Diagnosis not present

## 2018-10-18 ENCOUNTER — Encounter: Payer: Self-pay | Admitting: Family Medicine

## 2018-12-27 DIAGNOSIS — G4733 Obstructive sleep apnea (adult) (pediatric): Secondary | ICD-10-CM | POA: Diagnosis not present

## 2019-01-28 ENCOUNTER — Other Ambulatory Visit: Payer: Self-pay | Admitting: Family Medicine

## 2019-02-11 ENCOUNTER — Ambulatory Visit: Payer: BC Managed Care – PPO

## 2019-02-11 ENCOUNTER — Other Ambulatory Visit: Payer: Self-pay

## 2019-02-11 DIAGNOSIS — Z23 Encounter for immunization: Secondary | ICD-10-CM

## 2019-03-07 ENCOUNTER — Encounter: Payer: Self-pay | Admitting: Family Medicine

## 2019-03-20 ENCOUNTER — Other Ambulatory Visit: Payer: Self-pay

## 2019-03-20 ENCOUNTER — Encounter: Payer: Self-pay | Admitting: Family Medicine

## 2019-03-20 ENCOUNTER — Ambulatory Visit (INDEPENDENT_AMBULATORY_CARE_PROVIDER_SITE_OTHER): Payer: BC Managed Care – PPO | Admitting: Family Medicine

## 2019-03-20 VITALS — BP 120/80 | HR 79 | Temp 97.7°F | Resp 16 | Ht 75.0 in | Wt 250.0 lb

## 2019-03-20 DIAGNOSIS — E039 Hypothyroidism, unspecified: Secondary | ICD-10-CM | POA: Diagnosis not present

## 2019-03-20 DIAGNOSIS — R7303 Prediabetes: Secondary | ICD-10-CM

## 2019-03-20 DIAGNOSIS — Z114 Encounter for screening for human immunodeficiency virus [HIV]: Secondary | ICD-10-CM

## 2019-03-20 DIAGNOSIS — M1A09X0 Idiopathic chronic gout, multiple sites, without tophus (tophi): Secondary | ICD-10-CM

## 2019-03-20 DIAGNOSIS — E669 Obesity, unspecified: Secondary | ICD-10-CM

## 2019-03-20 DIAGNOSIS — Z0000 Encounter for general adult medical examination without abnormal findings: Secondary | ICD-10-CM | POA: Diagnosis not present

## 2019-03-20 DIAGNOSIS — E7800 Pure hypercholesterolemia, unspecified: Secondary | ICD-10-CM

## 2019-03-20 DIAGNOSIS — Z125 Encounter for screening for malignant neoplasm of prostate: Secondary | ICD-10-CM | POA: Diagnosis not present

## 2019-03-20 DIAGNOSIS — Z6832 Body mass index (BMI) 32.0-32.9, adult: Secondary | ICD-10-CM

## 2019-03-20 DIAGNOSIS — G4733 Obstructive sleep apnea (adult) (pediatric): Secondary | ICD-10-CM

## 2019-03-20 NOTE — Assessment & Plan Note (Signed)
Previously well controlled Continue Synthroid at current dose Recheck TSH and adjust dose as needed

## 2019-03-20 NOTE — Assessment & Plan Note (Signed)
Continue simvastatin Continue diet and exercise Recheck FLP and CMP

## 2019-03-20 NOTE — Assessment & Plan Note (Signed)
Well-controlled With new mask, CPAP is fitting better Continue CPAP

## 2019-03-20 NOTE — Progress Notes (Signed)
Patient: Michael Reyes, Male    DOB: Sep 21, 1955, 63 y.o.   MRN: [id number] Visit Date: 03/20/2019  Today's Provider: Lavon Paganini, MD   Chief Complaint  Patient presents with   Annual Exam   Subjective:     Annual physical exam Michael Reyes is a 63 y.o. male who presents today for health maintenance and complete physical. He feels well. He reports exercising daily. He reports he is sleeping well. 03/11/2018 CPE 04/18/2017 Colonoscopy-polyps, Tubular adenoma. ----------------------------------------------------------------- Pain at base of 5th metatarsal x3 days on L foot.  Has had this before. Doing lots of walking on trails. Wants to hold off on XRay, but will call back if not improving.  Review of Systems  Constitutional: Negative.   HENT: Negative.   Eyes: Negative.   Respiratory: Negative.   Cardiovascular: Negative.   Gastrointestinal: Negative.   Endocrine: Negative.   Genitourinary: Negative.   Musculoskeletal: Negative.   Skin: Negative.   Allergic/Immunologic: Negative.   Neurological: Negative.   Hematological: Negative.   Psychiatric/Behavioral: Negative.     Social History      He  reports that he has never smoked. He has never used smokeless tobacco. He reports current alcohol use. He reports that he does not use drugs.       Social History   Socioeconomic History   Marital status: Married    Spouse name: Not on file   Number of children: 2   Years of education: PHD   Highest education level: Not on file  Occupational History   Occupation: Professor  Scientist, product/process development strain: Not on file   Food insecurity    Worry: Not on file    Inability: Not on Lexicographer needs    Medical: Not on file    Non-medical: Not on file  Tobacco Use   Smoking status: Never Smoker   Smokeless tobacco: Never Used  Substance and Sexual Activity   Alcohol use: Yes    Alcohol/week: 0.0 standard  drinks    Comment: Occasionally   Drug use: No   Sexual activity: Yes  Lifestyle   Physical activity    Days per week: 1 day    Minutes per session: 120 min   Stress: Not on file  Relationships   Social connections    Talks on phone: Not on file    Gets together: Not on file    Attends religious service: Not on file    Active member of club or organization: Not on file    Attends meetings of clubs or organizations: Not on file    Relationship status: Not on file  Other Topics Concern   Not on file  Social History Narrative   Not on file    No past medical history on file.   Patient Active Problem List   Diagnosis Date Noted   Rectal bleeding 03/06/2017   Dribbling following urination 12/08/2016   Ganglion cyst of finger of left hand 12/08/2016   History of adenomatous polyp of colon 02/24/2016   Obesity 02/24/2016   Allergic rhinitis 10/01/2015   Chronic cough 10/01/2015   Venous insufficiency of leg 07/27/2015   Anxiety 04/02/2015   Arteriosclerosis of coronary artery 04/02/2015   Dermatitis, eczematoid 04/02/2015   Prediabetes 04/02/2015   Hypercholesteremia 04/02/2015   Disorder of mitral valve 04/02/2015   Hypothyroidism 04/02/2015   Heart valve disease 04/02/2015   OSA (obstructive sleep apnea) 03/30/2014  Arthritis due to gout 05/08/2008   ED (erectile dysfunction) of organic origin 05/05/2008    Past Surgical History:  Procedure Laterality Date   COLONOSCOPY  2012   FINGER SURGERY Left 01/2016   cyst removal    KNEE SURGERY Right 12/2013   VASECTOMY      Family History        Family Status  Relation Name Status   Mother  Deceased   Father  Deceased at age 62's   Sister  Deceased   Brother  Alive        His family history includes Cancer in his sister; Diabetes in his father and sister; Healthy in his brother; Heart disease in his father; Obesity in his sister; Tongue cancer in his mother.      No Known  Allergies   Current Outpatient Medications:    allopurinol (ZYLOPRIM) 100 MG tablet, TAKE TWO TABLETS BY MOUTH DAILY AS DIRECTED, Disp: 180 tablet, Rfl: 4   aspirin 81 MG tablet, Take 81 mg by mouth daily. , Disp: , Rfl:    GLUCOSAMINE-CHONDROIT-VIT C-MN PO, Take 2 tablets by mouth daily. , Disp: , Rfl:    levothyroxine (SYNTHROID) 50 MCG tablet, TAKE 1 TABLET BY MOUTH DAILY, Disp: 90 tablet, Rfl: 3   MULTIPLE VITAMIN PO, Take 1 tablet by mouth daily. , Disp: , Rfl:    simvastatin (ZOCOR) 40 MG tablet, TAKE 1 TABLET BY MOUTH DAILY, Disp: 90 tablet, Rfl: 3   Patient Care Team: Virginia Crews, MD as PCP - General (Family Medicine)    Objective:    Vitals: BP 120/80 (BP Location: Right Arm, Patient Position: Sitting, Cuff Size: Large)    Pulse 79    Temp 97.7 F (36.5 C) (Temporal)    Resp 16    Ht 6' 3"  (1.905 m)    Wt 250 lb (113.4 kg)    BMI 31.25 kg/m    Vitals:   03/20/19 1008  BP: 120/80  Pulse: 79  Resp: 16  Temp: 97.7 F (36.5 C)  TempSrc: Temporal  Weight: 250 lb (113.4 kg)  Height: 6' 3"  (1.905 m)     Physical Exam Vitals signs reviewed.  Constitutional:      General: He is not in acute distress.    Appearance: Normal appearance. He is well-developed. He is not diaphoretic.  HENT:     Head: Normocephalic and atraumatic.     Right Ear: Tympanic membrane, ear canal and external ear normal.     Left Ear: Tympanic membrane, ear canal and external ear normal.  Eyes:     General: No scleral icterus.    Conjunctiva/sclera: Conjunctivae normal.     Pupils: Pupils are equal, round, and reactive to light.  Neck:     Musculoskeletal: Neck supple.     Thyroid: No thyromegaly.  Cardiovascular:     Rate and Rhythm: Normal rate and regular rhythm.     Pulses: Normal pulses.     Heart sounds: Normal heart sounds. No murmur.  Pulmonary:     Effort: Pulmonary effort is normal. No respiratory distress.     Breath sounds: Normal breath sounds. No wheezing or  rales.  Abdominal:     General: There is no distension.     Palpations: Abdomen is soft.     Tenderness: There is no abdominal tenderness.  Musculoskeletal:        General: No deformity.     Right lower leg: No edema.     Left lower  leg: No edema.     Comments: Mild tenderness to palpation over base of fifth metatarsal of left foot.  Range of motion is intact.  No swelling, bruising, erythema.  Lymphadenopathy:     Cervical: No cervical adenopathy.  Skin:    General: Skin is warm and dry.     Capillary Refill: Capillary refill takes less than 2 seconds.     Findings: No rash.  Neurological:     Mental Status: He is alert and oriented to person, place, and time. Mental status is at baseline.  Psychiatric:        Mood and Affect: Mood normal.        Behavior: Behavior normal.        Thought Content: Thought content normal.      Depression Screen PHQ 2/9 Scores 03/11/2018 03/06/2017 02/24/2016  PHQ - 2 Score 0 0 0  PHQ- 9 Score 0 - 0       Assessment & Plan:     Routine Health Maintenance and Physical Exam  Exercise Activities and Dietary recommendations Goals   None     Immunization History  Administered Date(s) Administered   Hepatitis A 04/06/2010, 12/09/2010   Hepatitis B, adult 03/28/2016, 05/16/2016, 09/27/2016   Influenza,inj,Quad PF,6+ Mos 02/11/2019   Influenza-Unspecified 01/30/2015, 01/09/2017, 02/19/2018   MMR 03/11/2018   Tdap 11/18/2013   Zoster Recombinat (Shingrix) 03/11/2018, 06/11/2018    Health Maintenance  Topic Date Due   HIV Screening  03/[birth year]/1969   COLONOSCOPY  04/18/2022   TETANUS/TDAP  11/19/2023   INFLUENZA VACCINE  Completed   Hepatitis C Screening  Completed     Discussed health benefits of physical activity, and encouraged him to engage in regular exercise appropriate for his age and condition.    -------------------------------------------------------------------- Problem List Items Addressed This Visit       Respiratory   OSA (obstructive sleep apnea)    Well-controlled With new mask, CPAP is fitting better Continue CPAP        Endocrine   Hypothyroidism    Previously well controlled Continue Synthroid at current dose Recheck TSH and adjust dose as needed      Relevant Orders   TSH     Other   Prediabetes    Recheck A1c Continue diet and exercise      Relevant Orders   CBC   Hemoglobin A1c   Hypercholesteremia    Continue simvastatin Continue diet and exercise Recheck FLP and CMP      Relevant Orders   CMP (Comprehensive metabolic panel)   Lipid panel   CBC   Obesity    Discussed importance of healthy weight management Discussed diet and exercise Congratulated him on significant weight loss-63 pounds over the last 2 years       Other Visit Diagnoses    Encounter for annual physical exam    -  Primary   Relevant Orders   HIV antibody (with reflex)   CMP (Comprehensive metabolic panel)   Lipid panel   CBC   TSH   Hemoglobin A1c   Uric acid   PSA Total (Reflex To Free)   Screening for HIV (human immunodeficiency virus)       Relevant Orders   HIV antibody (with reflex)   Chronic gout of multiple sites, unspecified cause       Relevant Orders   Uric acid   Prostate cancer screening       Relevant Orders   PSA Total (Reflex To Free)  Return in about 6 months (around 09/17/2019) for chronic disease f/u.   The entirety of the information documented in the History of Present Illness, Review of Systems and Physical Exam were personally obtained by me. Portions of this information were initially documented by Lynford Humphrey and Meyer Cory, CMA and reviewed by me for thoroughness and accuracy.    Aerin Delany, Dionne Bucy, MD MPH [facility]

## 2019-03-20 NOTE — Assessment & Plan Note (Signed)
Discussed importance of healthy weight management Discussed diet and exercise Congratulated him on significant weight loss-63 pounds over the last 2 years

## 2019-03-20 NOTE — Assessment & Plan Note (Signed)
Recheck A1c Continue diet and exercise

## 2019-03-20 NOTE — Patient Instructions (Signed)
Preventive Care 40-64 Years Old, Male Preventive care refers to lifestyle choices and visits with your health care provider that can promote health and wellness. This includes:  A yearly physical exam. This is also called an annual well check.  Regular dental and eye exams.  Immunizations.  Screening for certain conditions.  Healthy lifestyle choices, such as eating a healthy diet, getting regular exercise, not using drugs or products that contain nicotine and tobacco, and limiting alcohol use. What can I expect for my preventive care visit? Physical exam Your health care provider will check:  Height and weight. These may be used to calculate body mass index (BMI), which is a measurement that tells if you are at a healthy weight.  Heart rate and blood pressure.  Your skin for abnormal spots. Counseling Your health care provider may ask you questions about:  Alcohol, tobacco, and drug use.  Emotional well-being.  Home and relationship well-being.  Sexual activity.  Eating habits.  Work and work environment. What immunizations do I need?  Influenza (flu) vaccine  This is recommended every year. Tetanus, diphtheria, and pertussis (Tdap) vaccine  You may need a Td booster every 10 years. Varicella (chickenpox) vaccine  You may need this vaccine if you have not already been vaccinated. Zoster (shingles) vaccine  You may need this after age 60. Measles, mumps, and rubella (MMR) vaccine  You may need at least one dose of MMR if you were born in 1957 or later. You may also need a second dose. Pneumococcal conjugate (PCV13) vaccine  You may need this if you have certain conditions and were not previously vaccinated. Pneumococcal polysaccharide (PPSV23) vaccine  You may need one or two doses if you smoke cigarettes or if you have certain conditions. Meningococcal conjugate (MenACWY) vaccine  You may need this if you have certain conditions. Hepatitis A vaccine   You may need this if you have certain conditions or if you travel or work in places where you may be exposed to hepatitis A. Hepatitis B vaccine  You may need this if you have certain conditions or if you travel or work in places where you may be exposed to hepatitis B. Haemophilus influenzae type b (Hib) vaccine  You may need this if you have certain risk factors. Human papillomavirus (HPV) vaccine  If recommended by your health care provider, you may need three doses over 6 months. You may receive vaccines as individual doses or as more than one vaccine together in one shot (combination vaccines). Talk with your health care provider about the risks and benefits of combination vaccines. What tests do I need? Blood tests  Lipid and cholesterol levels. These may be checked every 5 years, or more frequently if you are over 50 years old.  Hepatitis C test.  Hepatitis B test. Screening  Lung cancer screening. You may have this screening every year starting at age 55 if you have a 30-pack-year history of smoking and currently smoke or have quit within the past 15 years.  Prostate cancer screening. Recommendations will vary depending on your family history and other risks.  Colorectal cancer screening. All adults should have this screening starting at age 50 and continuing until age 75. Your health care provider may recommend screening at age 45 if you are at increased risk. You will have tests every 1-10 years, depending on your results and the type of screening test.  Diabetes screening. This is done by checking your blood sugar (glucose) after you have not eaten   for a while (fasting). You may have this done every 1-3 years.  Sexually transmitted disease (STD) testing. Follow these instructions at home: Eating and drinking  Eat a diet that includes fresh fruits and vegetables, whole grains, lean protein, and low-fat dairy products.  Take vitamin and mineral supplements as recommended  by your health care provider.  Do not drink alcohol if your health care provider tells you not to drink.  If you drink alcohol: ? Limit how much you have to 0-2 drinks a day. ? Be aware of how much alcohol is in your drink. In the [place] one drink equals one 12 oz bottle of beer (355 mL), one 5 oz glass of wine (148 mL), or one 1 oz glass of hard liquor (44 mL). Lifestyle  Take daily care of your teeth and gums.  Stay active. Exercise for at least 30 minutes on 5 or more days each week.  Do not use any products that contain nicotine or tobacco, such as cigarettes, e-cigarettes, and chewing tobacco. If you need help quitting, ask your health care provider.  If you are sexually active, practice safe sex. Use a condom or other form of protection to prevent STIs (sexually transmitted infections).  Talk with your health care provider about taking a low-dose aspirin every day starting at age 33. What's next?  Go to your health care provider once a year for a well check visit.  Ask your health care provider how often you should have your eyes and teeth checked.  Stay up to date on all vaccines. This information is not intended to replace advice given to you by your health care provider. Make sure you discuss any questions you have with your health care provider. Document Released: 05/14/2015 Document Revised: 04/11/2018 Document Reviewed: 04/11/2018 Elsevier Patient Education  2020 [employer].

## 2019-03-21 ENCOUNTER — Telehealth: Payer: Self-pay

## 2019-03-21 LAB — LIPID PANEL
Chol/HDL Ratio: 3.5 ratio (ref 0.0–5.0)
Cholesterol, Total: 156 mg/dL (ref 100–199)
HDL: 45 mg/dL (ref >39)
LDL Chol Calc (NIH): 93 mg/dL (ref 0–99)
Triglycerides: 100 mg/dL (ref 0–149)
VLDL Cholesterol Cal: 18 mg/dL (ref 5–40)

## 2019-03-21 LAB — COMPREHENSIVE METABOLIC PANEL
ALT: 39 IU/L (ref 0–44)
AST: 32 IU/L (ref 0–40)
Albumin/Globulin Ratio: 1.8 (ref 1.2–2.2)
Albumin: 4.8 g/dL (ref 3.8–4.8)
Alkaline Phosphatase: 85 IU/L (ref 39–117)
BUN/Creatinine Ratio: 15 (ref 10–24)
BUN: 15 mg/dL (ref 8–27)
Bilirubin Total: 0.4 mg/dL (ref 0.0–1.2)
CO2: 22 mmol/L (ref 20–29)
Calcium: 9.7 mg/dL (ref 8.6–10.2)
Chloride: 105 mmol/L (ref 96–106)
Creatinine, Ser: 1.02 mg/dL (ref 0.76–1.27)
GFR calc Af Amer: 89 mL/min/{1.73_m2} (ref >59)
GFR calc non Af Amer: 77 mL/min/{1.73_m2} (ref >59)
Globulin, Total: 2.6 g/dL (ref 1.5–4.5)
Glucose: 105 mg/dL — ABNORMAL HIGH (ref 65–99)
Potassium: 4.3 mmol/L (ref 3.5–5.2)
Sodium: 143 mmol/L (ref 134–144)
Total Protein: 7.4 g/dL (ref 6.0–8.5)

## 2019-03-21 LAB — CBC
Hematocrit: 51.5 % — ABNORMAL HIGH (ref 37.5–51.0)
Hemoglobin: 17.3 g/dL (ref 13.0–17.7)
MCH: 32.3 pg (ref 26.6–33.0)
MCHC: 33.6 g/dL (ref 31.5–35.7)
MCV: 96 fL (ref 79–97)
Platelets: 215 10*3/uL (ref 150–450)
RBC: 5.35 x10E6/uL (ref 4.14–5.80)
RDW: 12.4 % (ref 11.6–15.4)
WBC: 4 10*3/uL (ref 3.4–10.8)

## 2019-03-21 LAB — TSH: TSH: 2.93 u[IU]/mL (ref 0.450–4.500)

## 2019-03-21 LAB — PSA TOTAL (REFLEX TO FREE): Prostate Specific Ag, Serum: 0.7 ng/mL (ref 0.0–4.0)

## 2019-03-21 LAB — HEMOGLOBIN A1C
Est. average glucose Bld gHb Est-mCnc: 111 mg/dL
Hgb A1c MFr Bld: 5.5 % (ref 4.8–5.6)

## 2019-03-21 LAB — URIC ACID: Uric Acid: 4.5 mg/dL (ref 3.7–8.6)

## 2019-03-21 LAB — HIV ANTIBODY (ROUTINE TESTING W REFLEX): HIV Screen 4th Generation wRfx: NONREACTIVE

## 2019-03-21 NOTE — Telephone Encounter (Signed)
Viewed by Asencion Gowda on 03/21/2019 11:14 AM

## 2019-03-21 NOTE — Telephone Encounter (Signed)
-----   Message from Virginia Crews, MD sent at 03/21/2019 10:58 AM EST ----- Normal labs

## 2019-05-26 ENCOUNTER — Other Ambulatory Visit: Payer: Self-pay | Admitting: Family Medicine

## 2019-08-30 ENCOUNTER — Other Ambulatory Visit: Payer: Self-pay | Admitting: Family Medicine

## 2019-08-30 DIAGNOSIS — E7800 Pure hypercholesterolemia, unspecified: Secondary | ICD-10-CM

## 2019-10-14 ENCOUNTER — Other Ambulatory Visit: Payer: Self-pay | Admitting: Family Medicine

## 2019-10-14 MED ORDER — LEVOTHYROXINE SODIUM 50 MCG PO TABS: 50.0000 ug | ORAL_TABLET | Freq: Every day | ORAL | 0 refills | Status: DC

## 2019-10-14 NOTE — Telephone Encounter (Signed)
Patient requesting a short supply of levothyroxine (SYNTHROID) 50 MCG tablet until mail order is received.     [place], [place] Phone:  [phone]  Fax:  [phone]

## 2019-10-15 ENCOUNTER — Other Ambulatory Visit: Payer: Self-pay | Admitting: Family Medicine

## 2019-10-15 DIAGNOSIS — E7800 Pure hypercholesterolemia, unspecified: Secondary | ICD-10-CM

## 2019-10-15 MED ORDER — ALLOPURINOL 100 MG PO TABS: 100.0000 mg | ORAL_TABLET | Freq: Two times a day (BID) | ORAL | 0 refills | Status: DC

## 2019-10-15 MED ORDER — SIMVASTATIN 40 MG PO TABS: 40.0000 mg | ORAL_TABLET | Freq: Every day | ORAL | 0 refills | Status: DC

## 2019-10-15 MED ORDER — LEVOTHYROXINE SODIUM 50 MCG PO TABS: 50.0000 ug | ORAL_TABLET | Freq: Every day | ORAL | 0 refills | Status: DC

## 2019-10-15 NOTE — Telephone Encounter (Signed)
[facility] faxed refill request for the following medications:  1. simvastatin (ZOCOR) 40 MG tablet  2. levothyroxine (SYNTHROID) 50 MCG tablet 3. allopurinol (ZYLOPRIM) 100 MG tablet  90 day supply LOV: 03/20/2019 Please advise. Thanks TNP

## 2019-12-17 ENCOUNTER — Encounter: Payer: Self-pay | Admitting: Dermatology

## 2019-12-17 ENCOUNTER — Other Ambulatory Visit: Payer: Self-pay

## 2019-12-17 ENCOUNTER — Ambulatory Visit: Payer: BC Managed Care – PPO | Admitting: Dermatology

## 2019-12-17 DIAGNOSIS — Z1283 Encounter for screening for malignant neoplasm of skin: Secondary | ICD-10-CM

## 2019-12-17 DIAGNOSIS — L814 Other melanin hyperpigmentation: Secondary | ICD-10-CM | POA: Diagnosis not present

## 2019-12-17 DIAGNOSIS — L309 Dermatitis, unspecified: Secondary | ICD-10-CM

## 2019-12-17 DIAGNOSIS — L820 Inflamed seborrheic keratosis: Secondary | ICD-10-CM

## 2019-12-17 DIAGNOSIS — D239 Other benign neoplasm of skin, unspecified: Secondary | ICD-10-CM

## 2019-12-17 DIAGNOSIS — L821 Other seborrheic keratosis: Secondary | ICD-10-CM

## 2019-12-17 DIAGNOSIS — D2372 Other benign neoplasm of skin of left lower limb, including hip: Secondary | ICD-10-CM

## 2019-12-17 DIAGNOSIS — L578 Other skin changes due to chronic exposure to nonionizing radiation: Secondary | ICD-10-CM

## 2019-12-17 DIAGNOSIS — D1800 Hemangioma unspecified site: Secondary | ICD-10-CM

## 2019-12-17 DIAGNOSIS — D229 Melanocytic nevi, unspecified: Secondary | ICD-10-CM

## 2019-12-17 NOTE — Patient Instructions (Addendum)
Recommend daily broad spectrum sunscreen SPF 30+ to sun-exposed areas, reapply every 2 hours as needed. Call for new or changing lesions.  Recommend taking Heliocare sun protection supplement daily in sunny weather for additional sun protection. For maximum protection on the sunniest days, you can take up to 2 capsules of regular Heliocare OR take 1 capsule of Heliocare Ultra. For prolonged exposure (such as a full day in the sun), you can repeat your dose of the supplement 4 hours after your first dose. Heliocare can be purchased at [facility] or at [email or web address].   

## 2019-12-17 NOTE — Progress Notes (Signed)
Follow-Up Visit   Subjective  Michael Reyes is a 64 y.o. male who presents for the following: Area of concern.  Patient presents today for UBSE, has an area of concern on his left mid back for aprox 6 months, has been crusty and itchy. Patient states no h/o skin cancer at this time  The following portions of the chart were reviewed this encounter and updated as appropriate:  Tobacco  Allergies  Meds  Problems  Med Hx  Surg Hx  Fam Hx      Review of Systems:  No other skin or systemic complaints except as noted in HPI or Assessment and Plan.  Objective  Well appearing patient in no apparent distress; mood and affect are within normal limits.  All skin waist up examined.  Objective  Left mid Back: Erythematous keratotic or waxy stuck-on papule or plaque.   Objective  Left Postauricular Area: Scaly pink plaques  Objective  Left Knee - Anterior: Firm pink/brown papulenodule with dimple sign.    Assessment & Plan  Inflamed seborrheic keratosis Left mid Back  Cryotherapy today Prior to procedure, discussed risks of blister formation, small wound, skin dyspigmentation, or rare scar following cryotherapy.    Destruction of lesion - Left mid Back  Destruction method: cryotherapy   Informed consent: discussed and consent obtained   Lesion destroyed using liquid nitrogen: Yes   Region frozen until ice ball extended beyond lesion: Yes   Outcome: patient tolerated procedure well with no complications   Post-procedure details: wound care instructions given    Dermatitis Left Postauricular Area  Chronic, intermittent. Active currently.   Possible contact or irritant derm caused by glasses, if worsens will recommend patch testing in future  Also has history of eyelid derm, clear today  Continue Desonide cream behind ears and to eyelids twice a day as needed If requiring it often for eyelids would recommend switch to pimecrolimus  Topical steroids (such as  triamcinolone, fluocinolone, fluocinonide, mometasone, clobetasol, halobetasol, betamethasone, hydrocortisone) can cause thinning and lightening of the skin if they are used for too long in the same area. Your physician has selected the right strength medicine for your problem and area affected on the body. Please use your medication only as directed by your physician to prevent side effects.     Dermatofibroma Left Knee - Anterior  Benign-appearing.  Observation.  Call clinic for new or changing lesions.   Lentigines - Scattered tan macules - Discussed due to sun exposure - Benign, observe - Call for any changes  Seborrheic Keratoses - Stuck-on, waxy, tan-brown papules and plaques  - Discussed benign etiology and prognosis. - Observe - Call for any changes  Melanocytic Nevi - Tan-brown and/or pink-flesh-colored symmetric macules and papules - Benign appearing on exam today - Observation - Call clinic for new or changing moles - Recommend daily use of broad spectrum spf 30+ sunscreen to sun-exposed areas.   Hemangiomas - Red papules - Discussed benign nature - Observe - Call for any changes  Actinic Damage - diffuse scaly erythematous macules with underlying dyspigmentation - Recommend daily broad spectrum sunscreen SPF 30+ to sun-exposed areas, reapply every 2 hours as needed.  - Call for new or changing lesions.  Skin cancer screening performed today.   Return in about 1 year (around 12/16/2020) for TBSE.  I, Donzetta Kohut, CMA, am acting as scribe for Forest Gleason, MD .  Documentation: I have reviewed the above documentation for accuracy and completeness, and I agree with the  above.  Forest Gleason, MD

## 2019-12-24 ENCOUNTER — Ambulatory Visit: Payer: BLUE CROSS/BLUE SHIELD | Admitting: Dermatology

## 2019-12-27 ENCOUNTER — Other Ambulatory Visit: Payer: Self-pay | Admitting: Family Medicine

## 2019-12-27 NOTE — Telephone Encounter (Signed)
° °  Notes to clinic Already had curtesy refill and no appt scheduled.

## 2019-12-30 NOTE — Telephone Encounter (Signed)
LMTCB for appointment.

## 2020-01-08 ENCOUNTER — Ambulatory Visit (INDEPENDENT_AMBULATORY_CARE_PROVIDER_SITE_OTHER): Payer: BC Managed Care – PPO | Admitting: Family Medicine

## 2020-01-08 ENCOUNTER — Other Ambulatory Visit: Payer: Self-pay

## 2020-01-08 ENCOUNTER — Encounter: Payer: Self-pay | Admitting: Family Medicine

## 2020-01-08 VITALS — BP 115/72 | HR 74 | Temp 98.4°F | Resp 16 | Ht 75.0 in | Wt 277.0 lb

## 2020-01-08 DIAGNOSIS — E039 Hypothyroidism, unspecified: Secondary | ICD-10-CM | POA: Diagnosis not present

## 2020-01-08 DIAGNOSIS — E7800 Pure hypercholesterolemia, unspecified: Secondary | ICD-10-CM

## 2020-01-08 DIAGNOSIS — R7303 Prediabetes: Secondary | ICD-10-CM | POA: Diagnosis not present

## 2020-01-08 DIAGNOSIS — Z23 Encounter for immunization: Secondary | ICD-10-CM

## 2020-01-08 DIAGNOSIS — Z6832 Body mass index (BMI) 32.0-32.9, adult: Secondary | ICD-10-CM

## 2020-01-08 DIAGNOSIS — E669 Obesity, unspecified: Secondary | ICD-10-CM

## 2020-01-08 MED ORDER — LEVOTHYROXINE SODIUM 50 MCG PO TABS: 50.0000 ug | ORAL_TABLET | Freq: Every day | ORAL | 1 refills | Status: DC

## 2020-01-08 MED ORDER — ALLOPURINOL 100 MG PO TABS
100.0000 mg | ORAL_TABLET | Freq: Two times a day (BID) | ORAL | 1 refills | Status: DC
Start: 2020-01-08 — End: 2020-04-19

## 2020-01-08 MED ORDER — SIMVASTATIN 40 MG PO TABS: 40.0000 mg | ORAL_TABLET | Freq: Every day | ORAL | 1 refills | Status: DC

## 2020-01-08 NOTE — Patient Instructions (Signed)
Pneumococcal Polysaccharide Vaccine (PPSV23): What You Need to Know 1. Why get vaccinated? Pneumococcal polysaccharide vaccine (PPSV23) can prevent pneumococcal disease. Pneumococcal disease refers to any illness caused by pneumococcal bacteria. These bacteria can cause many types of illnesses, including pneumonia, which is an infection of the lungs. Pneumococcal bacteria are one of the most common causes of pneumonia. Besides pneumonia, pneumococcal bacteria can also cause:  Ear infections  Sinus infections  Meningitis (infection of the tissue covering the brain and spinal cord)  Bacteremia (bloodstream infection) Anyone can get pneumococcal disease, but children under 2 years of age, people with certain medical conditions, adults 65 years or older, and cigarette smokers are at the highest risk. Most pneumococcal infections are mild. However, some can result in long-term problems, such as brain damage or hearing loss. Meningitis, bacteremia, and pneumonia caused by pneumococcal disease can be fatal. 2. PPSV23 PPSV23 protects against 23 types of bacteria that cause pneumococcal disease. PPSV23 is recommended for:  All adults 65 years or older,  Anyone 2 years or older with certain medical conditions that can lead to an increased risk for pneumococcal disease. Most people need only one dose of PPSV23. A second dose of PPSV23, and another type of pneumococcal vaccine called PCV13, are recommended for certain high-risk groups. Your health care provider can give you more information. People 65 years or older should get a dose of PPSV23 even if they have already gotten one or more doses of the vaccine before they turned 65. 3. Talk with your health care provider Tell your vaccine provider if the person getting the vaccine:  Has had an allergic reaction after a previous dose of PPSV23, or has any severe, life-threatening allergies. In some cases, your health care provider may decide to postpone  PPSV23 vaccination to a future visit. People with minor illnesses, such as a cold, may be vaccinated. People who are moderately or severely ill should usually wait until they recover before getting PPSV23. Your health care provider can give you more information. 4. Risks of a vaccine reaction  Redness or pain where the shot is given, feeling tired, fever, or muscle aches can happen after PPSV23. People sometimes faint after medical procedures, including vaccination. Tell your provider if you feel dizzy or have vision changes or ringing in the ears. As with any medicine, there is a very remote chance of a vaccine causing a severe allergic reaction, other serious injury, or death. 5. What if there is a serious problem? An allergic reaction could occur after the vaccinated person leaves the clinic. If you see signs of a severe allergic reaction (hives, swelling of the face and throat, difficulty breathing, a fast heartbeat, dizziness, or weakness), call 9-1-1 and get the person to the nearest hospital. For other signs that concern you, call your health care provider. Adverse reactions should be reported to the Vaccine Adverse Event Reporting System (VAERS). Your health care provider will usually file this report, or you can do it yourself. Visit the VAERS website at www.vaers.hhs.gov or call 1-800-822-7967. VAERS is only for reporting reactions, and VAERS staff do not give medical advice. 6. How can I learn more?  Ask your health care provider.  Call your local or state health department.  Contact the Centers for Disease Control and Prevention (CDC): ? Call 1-800-232-4636 (1-800-CDC-INFO) or ? Visit CDC's website at www.cdc.gov/vaccines CDC Vaccine Information Statement PPSV23 Vaccine (02/27/2018) This information is not intended to replace advice given to you by your health care provider. Make sure you   discuss any questions you have with your health care provider. Document Revised: 08/06/2018  Document Reviewed: 11/27/2017 Elsevier Patient Education  [place].   Influenza (Flu) Vaccine (Inactivated or Recombinant): What You Need to Know 1. Why get vaccinated? Influenza vaccine can prevent influenza (flu). Flu is a contagious disease that spreads around the [place] every year, usually between October and May. Anyone can get the flu, but it is more dangerous for some people. Infants and young children, people 89 years of age and older, pregnant women, and people with certain health conditions or a weakened immune system are at greatest risk of flu complications. Pneumonia, bronchitis, sinus infections and ear infections are examples of flu-related complications. If you have a medical condition, such as heart disease, cancer or diabetes, flu can make it worse. Flu can cause fever and chills, sore throat, muscle aches, fatigue, cough, headache, and runny or stuffy nose. Some people may have vomiting and diarrhea, though this is more common in children than adults. Each year thousands of people in the [place] die from flu, and many more are hospitalized. Flu vaccine prevents millions of illnesses and flu-related visits to the doctor each year. 2. Influenza vaccine CDC recommends everyone 25 months of age and older get vaccinated every flu season. Children 6 months through 85 years of age may need 2 doses during a single flu season. Everyone else needs only 1 dose each flu season. It takes about 2 weeks for protection to develop after vaccination. There are many flu viruses, and they are always changing. Each year a new flu vaccine is made to protect against three or four viruses that are likely to cause disease in the upcoming flu season. Even when the vaccine doesn't exactly match these viruses, it may still provide some protection. Influenza vaccine does not cause flu. Influenza vaccine may be given at the same time as other vaccines. 3. Talk with your health care  provider Tell your vaccine provider if the person getting the vaccine:  Has had an allergic reaction after a previous dose of influenza vaccine, or has any severe, life-threatening allergies.  Has ever had Guillain-Barr Syndrome (also called GBS). In some cases, your health care provider may decide to postpone influenza vaccination to a future visit. People with minor illnesses, such as a cold, may be vaccinated. People who are moderately or severely ill should usually wait until they recover before getting influenza vaccine. Your health care provider can give you more information. 4. Risks of a vaccine reaction  Soreness, redness, and swelling where shot is given, fever, muscle aches, and headache can happen after influenza vaccine.  There may be a very small increased risk of Guillain-Barr Syndrome (GBS) after inactivated influenza vaccine (the flu shot). Young children who get the flu shot along with pneumococcal vaccine (PCV13), and/or DTaP vaccine at the same time might be slightly more likely to have a seizure caused by fever. Tell your health care provider if a child who is getting flu vaccine has ever had a seizure. People sometimes faint after medical procedures, including vaccination. Tell your provider if you feel dizzy or have vision changes or ringing in the ears. As with any medicine, there is a very remote chance of a vaccine causing a severe allergic reaction, other serious injury, or death. 5. What if there is a serious problem? An allergic reaction could occur after the vaccinated person leaves the clinic. If you see signs of a severe allergic reaction (hives, swelling of  the face and throat, difficulty breathing, a fast heartbeat, dizziness, or weakness), call 9-1-1 and get the person to the nearest hospital. For other signs that concern you, call your health care provider. Adverse reactions should be reported to the Vaccine Adverse Event Reporting System (VAERS). Your health  care provider will usually file this report, or you can do it yourself. Visit the VAERS website at www.vaers.SamedayNews.es or call 854-729-3060.VAERS is only for reporting reactions, and VAERS staff do not give medical advice. 6. The National Vaccine Injury Compensation Program The [employer] Vaccine Injury Compensation Program (VICP) is a federal program that was created to compensate people who may have been injured by certain vaccines. Visit the VICP website at GoldCloset.com.ee or call (949)521-5672 to learn about the program and about filing a claim. There is a time limit to file a claim for compensation. 7. How can I learn more?  Ask your healthcare provider.  Call your local or state health department.  Contact the Centers for Disease Control and Prevention (CDC): ? Call (438)151-5386 (1-800-CDC-INFO) or ? Visit CDC's https://gibson.com/ Vaccine Information Statement (Interim) Inactivated Influenza Vaccine (12/13/2017) This information is not intended to replace advice given to you by your health care provider. Make sure you discuss any questions you have with your health care provider. Document Revised: 08/06/2018 Document Reviewed: 12/17/2017 Elsevier Patient Education  [place]. Prediabetes Prediabetes is the condition of having a blood sugar (blood glucose) level that is higher than it should be, but not high enough for you to be diagnosed with type 2 diabetes. Having prediabetes puts you at risk for developing type 2 diabetes (type 2 diabetes mellitus). Prediabetes may be called impaired glucose tolerance or impaired fasting glucose. Prediabetes usually does not cause symptoms. Your health care provider can diagnose this condition with blood tests. You may be tested for prediabetes if you are overweight and if you have at least one other risk factor for prediabetes. What is blood glucose, and how is it measured? Blood glucose refers to the amount of glucose in your  bloodstream. Glucose comes from eating foods that contain sugars and starches (carbohydrates), which the body breaks down into glucose. Your blood glucose level may be measured in mg/dL (milligrams per deciliter) or mmol/L (millimoles per liter). Your blood glucose may be checked with one or more of the following blood tests:  A fasting blood glucose (FBG) test. You will not be allowed to eat (you will fast) for 8 hours or longer before a blood sample is taken. ? A normal range for FBG is 70-100 mg/dl (3.9-5.6 mmol/L).  An A1c (hemoglobin A1c) blood test. This test provides information about blood glucose control over the previous 2?42months.  An oral glucose tolerance test (OGTT). This test measures your blood glucose at two times: ? After fasting. This is your baseline level. ? Two hours after you drink a beverage that contains glucose. You may be diagnosed with prediabetes:  If your FBG is 100?125 mg/dL (5.6-6.9 mmol/L).  If your A1c level is 5.7?6.4%.  If your OGTT result is 140?199 mg/dL (7.8-11 mmol/L). These blood tests may be repeated to confirm your diagnosis. How can this condition affect me? The pancreas produces a hormone (insulin) that helps to move glucose from the bloodstream into cells. When cells in the body do not respond properly to insulin that the body makes (insulin resistance), excess glucose builds up in the blood instead of going into cells. As a result, high blood glucose (hyperglycemia) can develop, which can  cause many complications. Hyperglycemia is a symptom of prediabetes. Having high blood glucose for a long time is dangerous. Too much glucose in your blood can damage your nerves and blood vessels. Long-term damage can lead to complications from diabetes, which may include:  Heart disease.  Stroke.  Blindness.  Kidney disease.  Depression.  Poor circulation in the feet and legs, which could lead to surgical removal (amputation) in severe cases. What  can increase my risk? Risk factors for prediabetes include:  Having a family member with type 2 diabetes.  Being overweight or obese.  Being older than age 48.  Being of American [place], African-American, Hispanic/Latino, or Asian/Pacific Islander descent.  Having an inactive (sedentary) lifestyle.  Having a history of heart disease.  History of gestational diabetes or polycystic ovary syndrome (PCOS), in women.  Having low levels of good cholesterol (HDL-C) or high levels of blood fats (triglycerides).  Having high blood pressure. What actions can I take to prevent diabetes?      Be physically active. ? Do moderate-intensity physical activity for 30 or more minutes on 5 or more days of the week, or as much as told by your health care provider. This could be brisk walking, biking, or water aerobics. ? Ask your health care provider what activities are safe for you. A mix of physical activities may be best, such as walking, swimming, cycling, and strength training.  Lose weight as told by your health care provider. ? Losing 5-7% of your body weight can reverse insulin resistance. ? Your health care provider can determine how much weight loss is best for you and can help you lose weight safely.  Follow a healthy meal plan. This includes eating lean proteins, complex carbohydrates, fresh fruits and vegetables, low-fat dairy products, and healthy fats. ? Follow instructions from your health care provider about eating or drinking restrictions. ? Make an appointment to see a diet and nutrition specialist (registered dietitian) to help you create a healthy eating plan that is right for you.  Do not smoke or use any tobacco products, such as cigarettes, chewing tobacco, and e-cigarettes. If you need help quitting, ask your health care provider.  Take over-the-counter and prescription medicines as told by your health care provider. You may be prescribed medicines that help lower the risk  of type 2 diabetes.  Keep all follow-up visits as told by your health care provider. This is important. Summary  Prediabetes is the condition of having a blood sugar (blood glucose) level that is higher than it should be, but not high enough for you to be diagnosed with type 2 diabetes.  Having prediabetes puts you at risk for developing type 2 diabetes (type 2 diabetes mellitus).  To help prevent type 2 diabetes, make lifestyle changes such as being physically active and eating a healthy diet. Lose weight as told by your health care provider. This information is not intended to replace advice given to you by your health care provider. Make sure you discuss any questions you have with your health care provider. Document Revised: 08/09/2018 Document Reviewed: 06/08/2015 Elsevier Patient Education  [place]. High Cholesterol  High cholesterol is a condition in which the blood has high levels of a white, waxy, fat-like substance (cholesterol). The human body needs small amounts of cholesterol. The liver makes all the cholesterol that the body needs. Extra (excess) cholesterol comes from the food that we eat. Cholesterol is carried from the liver by the blood through the blood vessels. If  you have high cholesterol, deposits (plaques) may build up on the walls of your blood vessels (arteries). Plaques make the arteries narrower and stiffer. Cholesterol plaques increase your risk for heart attack and stroke. Work with your health care provider to keep your cholesterol levels in a healthy range. What increases the risk? This condition is more likely to develop in people who:  Eat foods that are high in animal fat (saturated fat) or cholesterol.  Are overweight.  Are not getting enough exercise.  Have a family history of high cholesterol. What are the signs or symptoms? There are no symptoms of this condition. How is this diagnosed? This condition may be diagnosed from the results of  a blood test.  If you are older than age 36, your health care provider may check your cholesterol every 4-6 years.  You may be checked more often if you already have high cholesterol or other risk factors for heart disease. The blood test for cholesterol measures:  "Bad" cholesterol (LDL cholesterol). This is the main type of cholesterol that causes heart disease. The desired level for LDL is less than 100.  "Good" cholesterol (HDL cholesterol). This type helps to protect against heart disease by cleaning the arteries and carrying the LDL away. The desired level for HDL is 60 or higher.  Triglycerides. These are fats that the body can store or burn for energy. The desired number for triglycerides is lower than 150.  Total cholesterol. This is a measure of the total amount of cholesterol in your blood, including LDL cholesterol, HDL cholesterol, and triglycerides. A healthy number is less than 200. How is this treated? This condition is treated with diet changes, lifestyle changes, and medicines. Diet changes  This may include eating more whole grains, fruits, vegetables, nuts, and fish.  This may also include cutting back on red meat and foods that have a lot of added sugar. Lifestyle changes  Changes may include getting at least 40 minutes of aerobic exercise 3 times a week. Aerobic exercises include walking, biking, and swimming. Aerobic exercise along with a healthy diet can help you maintain a healthy weight.  Changes may also include quitting smoking. Medicines  Medicines are usually given if diet and lifestyle changes have failed to reduce your cholesterol to healthy levels.  Your health care provider may prescribe a statin medicine. Statin medicines have been shown to reduce cholesterol, which can reduce the risk of heart disease. Follow these instructions at home: Eating and drinking If told by your health care provider:  Eat chicken (without skin), fish, veal, shellfish,  ground [place] breast, and round or loin cuts of red meat.  Do not eat fried foods or fatty meats, such as hot dogs and salami.  Eat plenty of fruits, such as apples.  Eat plenty of vegetables, such as broccoli, potatoes, and carrots.  Eat beans, peas, and lentils.  Eat grains such as barley, rice, couscous, and bulgur wheat.  Eat pasta without cream sauces.  Use skim or nonfat milk, and eat low-fat or nonfat yogurt and cheeses.  Do not eat or drink whole milk, cream, ice cream, egg yolks, or hard cheeses.  Do not eat stick margarine or tub margarines that contain trans fats (also called partially hydrogenated oils).  Do not eat saturated tropical oils, such as coconut oil and palm oil.  Do not eat cakes, cookies, crackers, or other baked goods that contain trans fats.  General instructions  Exercise as directed by your health care provider. Increase  your activity level with activities such as gardening, walking, and taking the stairs.  Take over-the-counter and prescription medicines only as told by your health care provider.  Do not use any products that contain nicotine or tobacco, such as cigarettes and e-cigarettes. If you need help quitting, ask your health care provider.  Keep all follow-up visits as told by your health care provider. This is important. Contact a health care provider if:  You are struggling to maintain a healthy diet or weight.  You need help to start on an exercise program.  You need help to stop smoking. Get help right away if:  You have chest pain.  You have trouble breathing. This information is not intended to replace advice given to you by your health care provider. Make sure you discuss any questions you have with your health care provider. Document Revised: 04/20/2017 Document Reviewed: 10/16/2015 Elsevier Patient Education  [place].

## 2020-01-08 NOTE — Assessment & Plan Note (Signed)
Discussed importance of healthy weight management Discussed diet and exercise  

## 2020-01-08 NOTE — Assessment & Plan Note (Signed)
Well controlled with last A1c 5.5 Discussed diet and exercise F/u in 6 months

## 2020-01-08 NOTE — Progress Notes (Signed)
Established patient visit  I,Michael Reyes,acting as a scribe for Michael Paganini, MD.,have documented all relevant documentation on the behalf of Michael Paganini, MD,as directed by  Michael Paganini, MD while in the presence of Michael Paganini, MD.   Patient: Michael Reyes   DOB: Aug 19, 1955   64 y.o. Male  MRN: [id number] Visit Date: 01/08/2020  Today's healthcare provider: Lavon Paganini, MD   Chief Complaint  Patient presents with  . Hyperglycemia  . Hyperlipidemia  . Hypothyroidism   Subjective    HPI  Prediabetes, Follow-up  Lab Results  Component Value Date   HGBA1C 5.5 03/20/2019   HGBA1C 5.6 06/11/2018   HGBA1C 5.6 03/08/2018   GLUCOSE 105 (H) 03/20/2019   GLUCOSE 84 03/08/2018   GLUCOSE 123 (H) 12/08/2016    Last seen for for this9 months ago.  Management since that visit includes check labs. Current symptoms include none and have been stable.  Prior visit with dietician: no Current diet: in general, a "healthy" diet   Current exercise: walking  Pertinent Labs:    Component Value Date/Time   CHOL 156 03/20/2019 1049   TRIG 100 03/20/2019 1049   CHOLHDL 3.5 03/20/2019 1049   CREATININE 1.02 03/20/2019 1049    Wt Readings from Last 3 Encounters:  01/08/20 277 lb (125.6 kg)  03/20/19 250 lb (113.4 kg)  06/11/18 258 lb 3.2 oz (117.1 kg)    ----------------------------------------------------------------------------------------- Lipid/Cholesterol, Follow-up  Last lipid panel Other pertinent labs  Lab Results  Component Value Date   CHOL 156 03/20/2019   HDL 45 03/20/2019   LDLCALC 93 03/20/2019   TRIG 100 03/20/2019   CHOLHDL 3.5 03/20/2019   Lab Results  Component Value Date   ALT 39 03/20/2019   AST 32 03/20/2019   PLT 215 03/20/2019   TSH 2.930 03/20/2019     He was last seen for this 9 months ago.  Management since that visit includes check labs.  He reports excellent compliance with treatment. He is not  having side effects.   Symptoms: No chest pain No chest pressure/discomfort  No dyspnea No lower extremity edema  No numbness or tingling of extremity No orthopnea  No palpitations No paroxysmal nocturnal dyspnea  No speech difficulty No syncope   Current diet: in general, a "healthy" diet   Current exercise: walking  The 10-year ASCVD risk score Mikey Bussing DC Jr., et al., 2013) is: 9.5%  --------------------------------------------------------------------------------------------------- Hypothyroid, follow-up  Lab Results  Component Value Date   TSH 2.930 03/20/2019   TSH 2.720 05/29/2018   TSH 4.680 (H) 03/08/2018   FREET4 0.77 (L) 03/08/2018   T4TOTAL 4.8 02/29/2016   Wt Readings from Last 3 Encounters:  01/08/20 277 lb (125.6 kg)  03/20/19 250 lb (113.4 kg)  06/11/18 258 lb 3.2 oz (117.1 kg)    He was last seen for hypothyroid 9 months ago.  Management since that visit includes check labs. He reports excellent compliance with treatment. He is not having side effects.   Symptoms: No change in energy level No constipation  No diarrhea No heat / cold intolerance  No nervousness No palpitations  No weight changes    -----------------------------------------------------------------------------------------   Patient Active Problem List   Diagnosis Date Noted  . Dribbling following urination 12/08/2016  . Ganglion cyst of finger of left hand 12/08/2016  . History of adenomatous polyp of colon 02/24/2016  . Obesity 02/24/2016  . Allergic rhinitis 10/01/2015  . Chronic cough 10/01/2015  . Venous insufficiency of  leg 07/27/2015  . Anxiety 04/02/2015  . Arteriosclerosis of coronary artery 04/02/2015  . Dermatitis, eczematoid 04/02/2015  . Prediabetes 04/02/2015  . Hypercholesteremia 04/02/2015  . Disorder of mitral valve 04/02/2015  . Hypothyroidism 04/02/2015  . Heart valve disease 04/02/2015  . OSA (obstructive sleep apnea) 03/30/2014  . Arthritis due to gout  05/08/2008  . ED (erectile dysfunction) of organic origin 05/05/2008   Social History   Tobacco Use  . Smoking status: Never Smoker  . Smokeless tobacco: Never Used  Vaping Use  . Vaping Use: Never used  Substance Use Topics  . Alcohol use: Yes    Alcohol/week: 0.0 standard drinks    Comment: Occasionally  . Drug use: No   No Known Allergies     Medications: Outpatient Medications Prior to Visit  Medication Sig  . aspirin 81 MG tablet Take 81 mg by mouth daily.   Marland Kitchen GLUCOSAMINE-CHONDROIT-VIT C-MN PO Take 1 tablet by mouth daily.   . MULTIPLE VITAMIN PO Take 1 tablet by mouth daily.   . [DISCONTINUED] allopurinol (ZYLOPRIM) 100 MG tablet Take 1 tablet (100 mg total) by mouth 2 (two) times daily. Please schedule office visit before any future refills  . [DISCONTINUED] levothyroxine (SYNTHROID) 50 MCG tablet TAKE 1 TABLET DAILY (PLEASE SCHEDULE OFFICE VISIT BEFORE ANY FURTHER REFILLS)  . [DISCONTINUED] simvastatin (ZOCOR) 40 MG tablet Take 1 tablet (40 mg total) by mouth daily. Please schedule office visit before any future refills   No facility-administered medications prior to visit.    Review of Systems  Constitutional: Negative.   Respiratory: Negative.   Cardiovascular: Negative.       Objective    BP 115/72 (BP Location: Right Arm, Patient Position: Sitting, Cuff Size: Large)   Pulse 74   Temp 98.4 F (36.9 C) (Oral)   Resp 16   Ht 6\' 3"  (1.905 m)   Wt 277 lb (125.6 kg)   BMI 34.62 kg/m  BP Readings from Last 3 Encounters:  01/08/20 115/72  03/20/19 120/80  06/11/18 127/75   Wt Readings from Last 3 Encounters:  01/08/20 277 lb (125.6 kg)  03/20/19 250 lb (113.4 kg)  06/11/18 258 lb 3.2 oz (117.1 kg)      Physical Exam Vitals reviewed.  Constitutional:      General: He is not in acute distress.    Appearance: Normal appearance. He is not diaphoretic.  HENT:     Head: Normocephalic and atraumatic.  Eyes:     General: No scleral icterus.     Conjunctiva/sclera: Conjunctivae normal.  Cardiovascular:     Rate and Rhythm: Normal rate and regular rhythm.     Pulses: Normal pulses.     Heart sounds: Normal heart sounds. No murmur heard.   Pulmonary:     Effort: Pulmonary effort is normal. No respiratory distress.     Breath sounds: Normal breath sounds. No wheezing or rhonchi.  Abdominal:     General: There is no distension.     Palpations: Abdomen is soft.     Tenderness: There is no abdominal tenderness.  Musculoskeletal:     Cervical back: Neck supple.     Right lower leg: No edema.     Left lower leg: No edema.  Lymphadenopathy:     Cervical: No cervical adenopathy.  Skin:    General: Skin is warm and dry.     Capillary Refill: Capillary refill takes less than 2 seconds.     Findings: No rash.  Neurological:  Mental Status: He is alert and oriented to person, place, and time.     Cranial Nerves: No cranial nerve deficit.  Psychiatric:        Mood and Affect: Mood normal.        Behavior: Behavior normal.      No results found for any visits on 01/08/20.  Assessment & Plan     Problem List Items Addressed This Visit      Endocrine   Hypothyroidism    Previously well controlled Continue Synthroid at current dose  Recheck TSH and adjust Synthroid as indicated        Relevant Medications   levothyroxine (SYNTHROID) 50 MCG tablet   Other Relevant Orders   Comprehensive metabolic panel   TSH     Other   Prediabetes - Primary    Well controlled with last A1c 5.5 Discussed diet and exercise F/u in 6 months       Relevant Orders   POCT glycosylated hemoglobin (Hb A1C)   Comprehensive metabolic panel   Hypercholesteremia    Well controlled Continue current medications Recheck metabolic panel F/u in 6 months       Relevant Medications   simvastatin (ZOCOR) 40 MG tablet   Other Relevant Orders   Comprehensive metabolic panel   Lipid Panel With LDL/HDL Ratio   Obesity    Discussed  importance of healthy weight management Discussed diet and exercise        Other Visit Diagnoses    Need for 23-polyvalent pneumococcal polysaccharide vaccine       Relevant Orders   Pneumococcal polysaccharide vaccine 23-valent greater than or equal to 2yo subcutaneous/IM (Completed)   Need for influenza vaccination       Relevant Orders   Flu vaccine HIGH DOSE PF (Completed)       Return in about 6 months (around 07/07/2020) for CPE.      I, Michael Paganini, MD, have reviewed all documentation for this visit. The documentation on 01/09/20 for the exam, diagnosis, procedures, and orders are all accurate and complete.   Fusako Tanabe, Dionne Bucy, MD, MPH [facility]

## 2020-01-08 NOTE — Assessment & Plan Note (Signed)
Well controlled Continue current medications Recheck metabolic panel F/u in 6 months  

## 2020-01-08 NOTE — Assessment & Plan Note (Signed)
Previously well controlled Continue Synthroid at current dose  Recheck TSH and adjust Synthroid as indicated   

## 2020-01-09 LAB — POCT GLYCOSYLATED HEMOGLOBIN (HGB A1C)
Est. average glucose Bld gHb Est-mCnc: 111
Hemoglobin A1C: 5.5 % (ref 4.0–5.6)

## 2020-01-14 DIAGNOSIS — R7303 Prediabetes: Secondary | ICD-10-CM | POA: Diagnosis not present

## 2020-01-14 DIAGNOSIS — E7800 Pure hypercholesterolemia, unspecified: Secondary | ICD-10-CM | POA: Diagnosis not present

## 2020-01-14 DIAGNOSIS — E039 Hypothyroidism, unspecified: Secondary | ICD-10-CM | POA: Diagnosis not present

## 2020-01-15 LAB — LIPID PANEL WITH LDL/HDL RATIO
Cholesterol, Total: 163 mg/dL (ref 100–199)
HDL: 44 mg/dL (ref >39)
LDL Chol Calc (NIH): 103 mg/dL — ABNORMAL HIGH (ref 0–99)
LDL/HDL Ratio: 2.3 ratio (ref 0.0–3.6)
Triglycerides: 86 mg/dL (ref 0–149)
VLDL Cholesterol Cal: 16 mg/dL (ref 5–40)

## 2020-01-15 LAB — COMPREHENSIVE METABOLIC PANEL
ALT: 33 IU/L (ref 0–44)
AST: 27 IU/L (ref 0–40)
Albumin/Globulin Ratio: 1.8 (ref 1.2–2.2)
Albumin: 4.4 g/dL (ref 3.8–4.8)
Alkaline Phosphatase: 80 IU/L (ref 44–121)
BUN/Creatinine Ratio: 19 (ref 10–24)
BUN: 21 mg/dL (ref 8–27)
Bilirubin Total: 0.4 mg/dL (ref 0.0–1.2)
CO2: 21 mmol/L (ref 20–29)
Calcium: 9.4 mg/dL (ref 8.6–10.2)
Chloride: 106 mmol/L (ref 96–106)
Creatinine, Ser: 1.11 mg/dL (ref 0.76–1.27)
GFR calc Af Amer: 80 mL/min/{1.73_m2} (ref >59)
GFR calc non Af Amer: 69 mL/min/{1.73_m2} (ref >59)
Globulin, Total: 2.5 g/dL (ref 1.5–4.5)
Glucose: 114 mg/dL — ABNORMAL HIGH (ref 65–99)
Potassium: 4.4 mmol/L (ref 3.5–5.2)
Sodium: 143 mmol/L (ref 134–144)
Total Protein: 6.9 g/dL (ref 6.0–8.5)

## 2020-01-15 LAB — TSH: TSH: 3.8 u[IU]/mL (ref 0.450–4.500)

## 2020-02-20 ENCOUNTER — Other Ambulatory Visit: Payer: Self-pay | Admitting: Family Medicine

## 2020-02-20 DIAGNOSIS — E7800 Pure hypercholesterolemia, unspecified: Secondary | ICD-10-CM

## 2020-04-12 ENCOUNTER — Ambulatory Visit: Payer: BC Managed Care – PPO | Admitting: Family Medicine

## 2020-04-19 ENCOUNTER — Other Ambulatory Visit: Payer: Self-pay | Admitting: Family Medicine

## 2020-04-19 ENCOUNTER — Telehealth: Payer: Self-pay

## 2020-04-19 DIAGNOSIS — R059 Cough, unspecified: Secondary | ICD-10-CM | POA: Diagnosis not present

## 2020-04-19 MED ORDER — ALLOPURINOL 100 MG PO TABS
100.0000 mg | ORAL_TABLET | Freq: Two times a day (BID) | ORAL | 0 refills | Status: DC
Start: 2020-04-19 — End: 2020-07-08

## 2020-04-19 NOTE — Telephone Encounter (Signed)
Copied from [place]. Topic: Quick Communication - See Telephone Encounter >> Apr 19, 2020  9:31 AM Loma [place] wrote: CRM for notification. See Telephone encounter for: 04/19/20. Pt  missed appt needed for allopurinol (ZYLOPRIM) 100 MG tablet Medication Date: 01/08/2020 Department: [facility] Ordering/Authorizing: Virginia Crews, MD  missed appt , sick, new message in wife's MRN probable covid per wife's oncologist CRM sent, he is out of med, possible virtual for both for med and covid issues with spouse. FU [phone] Dr Jacinto Reap

## 2020-04-21 LAB — FLU A+B NAA
Influenza A, NAA: NOT DETECTED
Influenza B, NAA: NOT DETECTED

## 2020-04-21 LAB — SARS-COV-2, NAA 2 DAY TAT

## 2020-04-21 LAB — NOVEL CORONAVIRUS, NAA: SARS-CoV-2, NAA: NOT DETECTED

## 2020-04-21 NOTE — Progress Notes (Signed)
Negative flu A and B. Seek care if any symptoms worsening at anytime.

## 2020-05-04 ENCOUNTER — Other Ambulatory Visit: Payer: Self-pay

## 2020-05-05 ENCOUNTER — Other Ambulatory Visit: Payer: Self-pay

## 2020-05-14 ENCOUNTER — Telehealth: Payer: Self-pay

## 2020-05-14 NOTE — Telephone Encounter (Signed)
Copied from [place] [phone]. Topic: General - Other >> May 14, 2020 11:08 AM Rainey Pines A wrote: Patient stated that his cough is still lingering and that the Robitussin is not working and he wants to know if Dr. B can send in a prescription cough medicine today. Please advise

## 2020-05-17 MED ORDER — BENZONATATE 100 MG PO CAPS: 100.0000 mg | ORAL_CAPSULE | Freq: Two times a day (BID) | ORAL | 0 refills | Status: DC

## 2020-05-17 NOTE — Telephone Encounter (Signed)
Just seeing the message as I was on admin Friday afternoon. Ok to send in tessalon 100mg  bid prn #20 r0

## 2020-05-21 ENCOUNTER — Other Ambulatory Visit: Payer: Self-pay | Admitting: Family Medicine

## 2020-05-29 ENCOUNTER — Other Ambulatory Visit: Payer: Self-pay | Admitting: Family Medicine

## 2020-06-03 ENCOUNTER — Telehealth: Payer: Self-pay

## 2020-06-03 NOTE — Telephone Encounter (Signed)
Copied from [place] [phone]. Topic: Quick Communication - Rx Refill/Question >> Jun 03, 2020 10:41 AM Hinda Lenis D wrote: Pt requesting a callback // issues with his pharmacy mail order / please advise

## 2020-06-04 ENCOUNTER — Encounter: Payer: Self-pay | Admitting: Family Medicine

## 2020-06-04 MED ORDER — LEVOTHYROXINE SODIUM 50 MCG PO TABS: 50.0000 ug | ORAL_TABLET | Freq: Every day | ORAL | 3 refills | Status: DC

## 2020-06-04 NOTE — Telephone Encounter (Signed)
Yes. Please send to local pharmacy and let him know. Thanks!

## 2020-06-04 NOTE — Telephone Encounter (Signed)
Medication sent to [facility], as requested.

## 2020-07-07 NOTE — Progress Notes (Signed)
Complete physical exam   Patient: Michael Reyes   DOB: 01-05-1956   65 y.o. Male  MRN: [id number] Visit Date: 07/08/2020  Today's healthcare provider: Lavon Paganini, MD   Chief Complaint  Patient presents with  . Annual Exam   Subjective    Michael Arkin Imran is a 65 y.o. male who presents today for a complete physical exam.  He reports consuming a general diet. The patient does not participate in regular exercise at present. He generally feels fairly well. He reports sleeping fairly well. He does not have additional problems to discuss today.  HPI    No past medical history on file. Past Surgical History:  Procedure Laterality Date  . COLONOSCOPY  2012  . FINGER SURGERY Left 01/2016   cyst removal   . KNEE SURGERY Right 12/2013  . VASECTOMY     Social History   Socioeconomic History  . Marital status: Married    Spouse name: Not on file  . Number of children: 2  . Years of education: PHD  . Highest education level: Not on file  Occupational History  . Occupation: Professor  Tobacco Use  . Smoking status: Never Smoker  . Smokeless tobacco: Never Used  Vaping Use  . Vaping Use: Never used  Substance and Sexual Activity  . Alcohol use: Yes    Alcohol/week: 0.0 standard drinks    Comment: Occasionally  . Drug use: No  . Sexual activity: Yes  Other Topics Concern  . Not on file  Social History Narrative  . Not on file   Social Determinants of Health   Financial Resource Strain: Not on file  Food Insecurity: Not on file  Transportation Needs: Not on file  Physical Activity: Not on file  Stress: Not on file  Social Connections: Not on file  Intimate Partner Violence: Not on file   Family Status  Relation Name Status  . Mother  Deceased  . Father  Deceased at age 59's  . Sister  Deceased  . Brother  Alive   Family History  Problem Relation Age of Onset  . Tongue cancer Mother   . Heart disease Father   . Diabetes Father   .  Obesity Sister   . Cancer Sister   . Diabetes Sister   . Healthy Brother    No Known Allergies  Patient Care Team: Virginia Crews, MD as PCP - General (Family Medicine)   Medications: Outpatient Medications Prior to Visit  Medication Sig  . aspirin 81 MG tablet Take 81 mg by mouth daily.   Marland Kitchen GLUCOSAMINE-CHONDROIT-VIT C-MN PO Take 1 tablet by mouth daily.   . MULTIPLE VITAMIN PO Take 1 tablet by mouth daily.   . [DISCONTINUED] allopurinol (ZYLOPRIM) 100 MG tablet Take 1 tablet (100 mg total) by mouth 2 (two) times daily. Please schedule office visit before any future refills  . [DISCONTINUED] levothyroxine (SYNTHROID) 50 MCG tablet Take 1 tablet (50 mcg total) by mouth daily.  . [DISCONTINUED] simvastatin (ZOCOR) 40 MG tablet TAKE 1 TABLET BY MOUTH EVERY DAY  . [DISCONTINUED] benzonatate (TESSALON PERLES) 100 MG capsule Take 1 capsule (100 mg total) by mouth 2 (two) times daily. (Patient not taking: Reported on 07/08/2020)   No facility-administered medications prior to visit.    Review of Systems  Constitutional: Negative.   HENT: Negative.   Eyes: Negative.   Respiratory: Positive for apnea. Negative for cough, choking, chest tightness, shortness of breath, wheezing and stridor.   Cardiovascular:  Negative.   Gastrointestinal: Negative.   Endocrine: Negative.   Genitourinary: Negative.   Musculoskeletal: Negative.   Skin: Negative.   Allergic/Immunologic: Negative.   Neurological: Negative.   Hematological: Negative.   Psychiatric/Behavioral: Negative.     Last CBC Lab Results  Component Value Date   WBC 4.0 03/20/2019   HGB 17.3 03/20/2019   HCT 51.5 (H) 03/20/2019   MCV 96 03/20/2019   MCH 32.3 03/20/2019   RDW 12.4 03/20/2019   PLT 215 33/00/7622   Last metabolic panel Lab Results  Component Value Date   GLUCOSE 114 (H) 01/14/2020   NA 143 01/14/2020   K 4.4 01/14/2020   CL 106 01/14/2020   CO2 21 01/14/2020   BUN 21 01/14/2020   CREATININE 1.11  01/14/2020   GFRNONAA 69 01/14/2020   GFRAA 80 01/14/2020   CALCIUM 9.4 01/14/2020   PROT 6.9 01/14/2020   ALBUMIN 4.4 01/14/2020   LABGLOB 2.5 01/14/2020   AGRATIO 1.8 01/14/2020   BILITOT 0.4 01/14/2020   ALKPHOS 80 01/14/2020   AST 27 01/14/2020   ALT 33 01/14/2020   Last lipids Lab Results  Component Value Date   CHOL 163 01/14/2020   HDL 44 01/14/2020   LDLCALC 103 (H) 01/14/2020   TRIG 86 01/14/2020   CHOLHDL 3.5 03/20/2019   Last hemoglobin A1c Lab Results  Component Value Date   HGBA1C 5.5 01/09/2020   Last thyroid functions Lab Results  Component Value Date   TSH 3.800 01/14/2020   T4TOTAL 4.8 02/29/2016   Last vitamin D Lab Results  Component Value Date   VD25OH 38.2 02/29/2016   Last vitamin B12 and Folate Lab Results  Component Value Date   VITAMINB12 341 12/08/2016      Objective    BP 122/76 (BP Location: Left Arm, Patient Position: Sitting, Cuff Size: Large)   Pulse 69   Temp (!) 97.1 F (36.2 C) (Oral)   Resp 16   Ht _0  (1.905 m)   Wt 281 lb (127.5 kg)   BMI 35.12 kg/m  BP Readings from Last 3 Encounters:  07/08/20 122/76  01/08/20 115/72  03/20/19 120/80   Wt Readings from Last 3 Encounters:  07/08/20 281 lb (127.5 kg)  01/08/20 277 lb (125.6 kg)  03/20/19 250 lb (113.4 kg)      Physical Exam Vitals reviewed.  Constitutional:      General: He is not in acute distress.    Appearance: Normal appearance. He is well-developed. He is not diaphoretic.  HENT:     Head: Normocephalic and atraumatic.     Right Ear: Tympanic membrane, ear canal and external ear normal.     Left Ear: Tympanic membrane, ear canal and external ear normal.  Eyes:     General: No scleral icterus.    Conjunctiva/sclera: Conjunctivae normal.     Pupils: Pupils are equal, round, and reactive to light.  Neck:     Thyroid: No thyromegaly.  Cardiovascular:     Rate and Rhythm: Normal rate and regular rhythm.     Pulses: Normal pulses.     Heart  sounds: Normal heart sounds. No murmur heard.   Pulmonary:     Effort: Pulmonary effort is normal. No respiratory distress.     Breath sounds: Normal breath sounds. No wheezing or rales.  Abdominal:     General: There is no distension.     Palpations: Abdomen is soft.     Tenderness: There is no abdominal tenderness.  Musculoskeletal:  General: No deformity.     Cervical back: Neck supple.     Right lower leg: No edema.     Left lower leg: No edema.  Lymphadenopathy:     Cervical: No cervical adenopathy.  Skin:    General: Skin is warm and dry.     Findings: No rash.  Neurological:     Mental Status: He is alert and oriented to person, place, and time. Mental status is at baseline.     Sensory: No sensory deficit.     Motor: No weakness.     Gait: Gait normal.  Psychiatric:        Mood and Affect: Mood normal.        Behavior: Behavior normal.        Thought Content: Thought content normal.     Last depression screening scores PHQ 2/9 Scores 07/08/2020 03/20/2019 03/11/2018  PHQ - 2 Score 0 0 0  PHQ- 9 Score 0 0 0   Last fall risk screening Fall Risk  07/08/2020  Falls in the past year? 0  Number falls in past yr: 0  Injury with Fall? 0  Follow up Falls evaluation completed   Last Audit-C alcohol use screening Alcohol Use Disorder Test (AUDIT) 07/08/2020  1. How often do you have a drink containing alcohol? 2  2. How many drinks containing alcohol do you have on a typical day when you are drinking? 0  3. How often do you have six or more drinks on one occasion? 0  AUDIT-C Score 2  Alcohol Brief Interventions/Follow-up AUDIT Score <7 follow-up not indicated   A score of 3 or more in women, and 4 or more in men indicates increased risk for alcohol abuse, EXCEPT if all of the points are from question 1   No results found for any visits on 07/08/20.  Assessment & Plan    Routine Health Maintenance and Physical Exam  Exercise Activities and Dietary  recommendations Goals   None     Immunization History  Administered Date(s) Administered  . Hepatitis A 04/06/2010, 12/09/2010  . Hepatitis B, adult 03/28/2016, 05/16/2016, 09/27/2016  . Influenza, High Dose Seasonal PF 01/08/2020  . Influenza,inj,Quad PF,6+ Mos 02/11/2019  . Influenza-Unspecified 01/30/2015, 01/09/2017, 02/19/2018  . MMR 03/11/2018  . PFIZER(Purple Top)SARS-COV-2 Vaccination 06/18/2019, 07/09/2019  . Pneumococcal Polysaccharide-23 01/08/2020  . Tdap 11/18/2013  . Zoster Recombinat (Shingrix) 03/11/2018, 06/11/2018    Health Maintenance  Topic Date Due  . COVID-19 Vaccine (3 - Pfizer risk 4-dose series) 08/06/2019  . PNA vac Low Risk Adult (2 of 2 - PCV13) 01/07/2021  . COLONOSCOPY (Pts 45-37yr Insurance coverage will need to be confirmed)  04/18/2022  . TETANUS/TDAP  11/19/2023  . INFLUENZA VACCINE  Completed  . Hepatitis C Screening  Completed  . HPV VACCINES  Aged Out    Discussed health benefits of physical activity, and encouraged him to engage in regular exercise appropriate for his age and condition.  Problem List Items Addressed This Visit      Respiratory   OSA (obstructive sleep apnea)    Well controlled Continue CPAP - good compliance        Endocrine   Hypothyroidism    Previously well controlled Continue Synthroid at current dose  Recheck TSH and adjust Synthroid as indicated        Relevant Medications   levothyroxine (SYNTHROID) 50 MCG tablet   Other Relevant Orders   TSH     Musculoskeletal and Integument   Arthritis  due to gout    Continue allopurinol  Recheck uric acid      Relevant Medications   allopurinol (ZYLOPRIM) 100 MG tablet   Other Relevant Orders   Uric acid     Other   Prediabetes    Well controlled Recommend low carb diet Recheck A1c        Relevant Orders   Comprehensive metabolic panel   Hemoglobin A1c   Hypercholesteremia    Previously well controlled Continue statin Repeat FLP and CMP       Relevant Medications   simvastatin (ZOCOR) 40 MG tablet   Other Relevant Orders   Comprehensive metabolic panel   Lipid Panel With LDL/HDL Ratio   Obesity    Discussed importance of healthy weight management Discussed diet and exercise        Other Visit Diagnoses    Encounter for annual physical exam    -  Primary   Relevant Orders   Comprehensive metabolic panel   Hemoglobin A1c   Lipid Panel With LDL/HDL Ratio   PSA   Prostate cancer screening       Relevant Orders   PSA       Return in about 6 months (around 01/08/2021) for chronic disease f/u.     I, Lavon Paganini, MD, have reviewed all documentation for this visit. The documentation on 07/08/20 for the exam, diagnosis, procedures, and orders are all accurate and complete.   Mischa Pollard, Dionne Bucy, MD, MPH [facility]

## 2020-07-07 NOTE — Patient Instructions (Signed)
Preventive Care 65 Years and Older, Male Preventive care refers to lifestyle choices and visits with your health care provider that can promote health and wellness. This includes:  A yearly physical exam. This is also called an annual wellness visit.  Regular dental and eye exams.  Immunizations.  Screening for certain conditions.  Healthy lifestyle choices, such as: ? Eating a healthy diet. ? Getting regular exercise. ? Not using drugs or products that contain nicotine and tobacco. ? Limiting alcohol use. What can I expect for my preventive care visit? Physical exam Your health care provider will check your:  Height and weight. These may be used to calculate your BMI (body mass index). BMI is a measurement that tells if you are at a healthy weight.  Heart rate and blood pressure.  Body temperature.  Skin for abnormal spots. Counseling Your health care provider may ask you questions about your:  Past medical problems.  Family's medical history.  Alcohol, tobacco, and drug use.  Emotional well-being.  Home life and relationship well-being.  Sexual activity.  Diet, exercise, and sleep habits.  History of falls.  Memory and ability to understand (cognition).  Work and work environment.  Access to firearms. What immunizations do I need? Vaccines are usually given at various ages, according to a schedule. Your health care provider will recommend vaccines for you based on your age, medical history, and lifestyle or other factors, such as travel or where you work.   What tests do I need? Blood tests  Lipid and cholesterol levels. These may be checked every 5 years, or more often depending on your overall health.  Hepatitis C test.  Hepatitis B test. Screening  Lung cancer screening. You may have this screening every year starting at age 55 if you have a 30-pack-year history of smoking and currently smoke or have quit within the past 15 years.  Colorectal  cancer screening. ? All adults should have this screening starting at age 50 and continuing until age 75. ? Your health care provider may recommend screening at age 45 if you are at increased risk. ? You will have tests every 1-10 years, depending on your results and the type of screening test.  Prostate cancer screening. Recommendations will vary depending on your family history and other risks.  Genital exam to check for testicular cancer or hernias.  Diabetes screening. ? This is done by checking your blood sugar (glucose) after you have not eaten for a while (fasting). ? You may have this done every 1-3 years.  Abdominal aortic aneurysm (AAA) screening. You may need this if you are a current or former smoker.  STD (sexually transmitted disease) testing, if you are at risk. Follow these instructions at home: Eating and drinking  Eat a diet that includes fresh fruits and vegetables, whole grains, lean protein, and low-fat dairy products. Limit your intake of foods with high amounts of sugar, saturated fats, and salt.  Take vitamin and mineral supplements as recommended by your health care provider.  Do not drink alcohol if your health care provider tells you not to drink.  If you drink alcohol: ? Limit how much you have to 0-2 drinks a day. ? Be aware of how much alcohol is in your drink. In the [place] one drink equals one 12 oz bottle of beer (355 mL), one 5 oz glass of wine (148 mL), or one 1 oz glass of hard liquor (44 mL).   Lifestyle  Take daily care of your teeth   and gums. Brush your teeth every morning and night with fluoride toothpaste. Floss one time each day.  Stay active. Exercise for at least 30 minutes 5 or more days each week.  Do not use any products that contain nicotine or tobacco, such as cigarettes, e-cigarettes, and chewing tobacco. If you need help quitting, ask your health care provider.  Do not use drugs.  If you are sexually active, practice safe sex.  Use a condom or other form of protection to prevent STIs (sexually transmitted infections).  Talk with your health care provider about taking a low-dose aspirin or statin.  Find healthy ways to cope with stress, such as: ? Meditation, yoga, or listening to music. ? Journaling. ? Talking to a trusted person. ? Spending time with friends and family. Safety  Always wear your seat belt while driving or riding in a vehicle.  Do not drive: ? If you have been drinking alcohol. Do not ride with someone who has been drinking. ? When you are tired or distracted. ? While texting.  Wear a helmet and other protective equipment during sports activities.  If you have firearms in your house, make sure you follow all gun safety procedures. What's next?  Visit your health care provider once a year for an annual wellness visit.  Ask your health care provider how often you should have your eyes and teeth checked.  Stay up to date on all vaccines. This information is not intended to replace advice given to you by your health care provider. Make sure you discuss any questions you have with your health care provider. Document Revised: 01/14/2019 Document Reviewed: 04/11/2018 Elsevier Patient Education  2021 [employer]vier Inc.  

## 2020-07-08 ENCOUNTER — Encounter: Payer: Self-pay | Admitting: Family Medicine

## 2020-07-08 ENCOUNTER — Other Ambulatory Visit: Payer: Self-pay

## 2020-07-08 ENCOUNTER — Ambulatory Visit (INDEPENDENT_AMBULATORY_CARE_PROVIDER_SITE_OTHER): Payer: BC Managed Care – PPO | Admitting: Family Medicine

## 2020-07-08 VITALS — BP 122/76 | HR 69 | Temp 97.1°F | Resp 16 | Ht 75.0 in | Wt 281.0 lb

## 2020-07-08 DIAGNOSIS — E7800 Pure hypercholesterolemia, unspecified: Secondary | ICD-10-CM

## 2020-07-08 DIAGNOSIS — Z125 Encounter for screening for malignant neoplasm of prostate: Secondary | ICD-10-CM

## 2020-07-08 DIAGNOSIS — G4733 Obstructive sleep apnea (adult) (pediatric): Secondary | ICD-10-CM

## 2020-07-08 DIAGNOSIS — R7303 Prediabetes: Secondary | ICD-10-CM

## 2020-07-08 DIAGNOSIS — E039 Hypothyroidism, unspecified: Secondary | ICD-10-CM | POA: Diagnosis not present

## 2020-07-08 DIAGNOSIS — E669 Obesity, unspecified: Secondary | ICD-10-CM

## 2020-07-08 DIAGNOSIS — Z6832 Body mass index (BMI) 32.0-32.9, adult: Secondary | ICD-10-CM

## 2020-07-08 DIAGNOSIS — M109 Gout, unspecified: Secondary | ICD-10-CM

## 2020-07-08 DIAGNOSIS — Z0000 Encounter for general adult medical examination without abnormal findings: Secondary | ICD-10-CM | POA: Diagnosis not present

## 2020-07-08 MED ORDER — ALLOPURINOL 100 MG PO TABS: 100.0000 mg | ORAL_TABLET | Freq: Two times a day (BID) | ORAL | 3 refills | Status: DC

## 2020-07-08 MED ORDER — SIMVASTATIN 40 MG PO TABS: 40.0000 mg | ORAL_TABLET | Freq: Every day | ORAL | 3 refills | Status: DC

## 2020-07-08 MED ORDER — LEVOTHYROXINE SODIUM 50 MCG PO TABS
50.0000 ug | ORAL_TABLET | Freq: Every day | ORAL | 3 refills | Status: DC
Start: 2020-07-08 — End: 2021-05-21

## 2020-07-08 MED ORDER — LEVOTHYROXINE SODIUM 50 MCG PO TABS: 50.0000 ug | ORAL_TABLET | Freq: Every day | ORAL | 3 refills | Status: DC

## 2020-07-08 NOTE — Assessment & Plan Note (Signed)
Well controlled Continue CPAP - good compliance

## 2020-07-08 NOTE — Assessment & Plan Note (Signed)
Previously well controlled Continue statin Repeat FLP and CMP  

## 2020-07-08 NOTE — Assessment & Plan Note (Signed)
Previously well controlled Continue Synthroid at current dose  Recheck TSH and adjust Synthroid as indicated   

## 2020-07-08 NOTE — Assessment & Plan Note (Signed)
Continue allopurinol  Recheck uric acid

## 2020-07-08 NOTE — Assessment & Plan Note (Signed)
Well controlled Recommend low carb diet Recheck A1c

## 2020-07-08 NOTE — Assessment & Plan Note (Signed)
Discussed importance of healthy weight management Discussed diet and exercise  

## 2020-07-09 LAB — LIPID PANEL WITH LDL/HDL RATIO
Cholesterol, Total: 152 mg/dL (ref 100–199)
HDL: 41 mg/dL (ref >39)
LDL Chol Calc (NIH): 90 mg/dL (ref 0–99)
LDL/HDL Ratio: 2.2 ratio (ref 0.0–3.6)
Triglycerides: 114 mg/dL (ref 0–149)
VLDL Cholesterol Cal: 21 mg/dL (ref 5–40)

## 2020-07-09 LAB — COMPREHENSIVE METABOLIC PANEL
ALT: 37 IU/L (ref 0–44)
AST: 24 IU/L (ref 0–40)
Albumin/Globulin Ratio: 1.5 (ref 1.2–2.2)
Albumin: 4.4 g/dL (ref 3.8–4.8)
Alkaline Phosphatase: 76 IU/L (ref 44–121)
BUN/Creatinine Ratio: 18 (ref 10–24)
BUN: 18 mg/dL (ref 8–27)
Bilirubin Total: 0.7 mg/dL (ref 0.0–1.2)
CO2: 21 mmol/L (ref 20–29)
Calcium: 9.8 mg/dL (ref 8.6–10.2)
Chloride: 104 mmol/L (ref 96–106)
Creatinine, Ser: 1 mg/dL (ref 0.76–1.27)
Globulin, Total: 2.9 g/dL (ref 1.5–4.5)
Glucose: 106 mg/dL — ABNORMAL HIGH (ref 65–99)
Potassium: 4.5 mmol/L (ref 3.5–5.2)
Sodium: 141 mmol/L (ref 134–144)
Total Protein: 7.3 g/dL (ref 6.0–8.5)
eGFR: 83 mL/min/{1.73_m2} (ref >59)

## 2020-07-09 LAB — HEMOGLOBIN A1C
Est. average glucose Bld gHb Est-mCnc: 114 mg/dL
Hgb A1c MFr Bld: 5.6 % (ref 4.8–5.6)

## 2020-07-09 LAB — TSH: TSH: 4.09 u[IU]/mL (ref 0.450–4.500)

## 2020-07-09 LAB — URIC ACID: Uric Acid: 4.4 mg/dL (ref 3.8–8.4)

## 2020-07-09 LAB — PSA: Prostate Specific Ag, Serum: 1.5 ng/mL (ref 0.0–4.0)

## 2020-08-19 ENCOUNTER — Other Ambulatory Visit: Payer: Self-pay | Admitting: Family Medicine

## 2020-08-19 DIAGNOSIS — E7800 Pure hypercholesterolemia, unspecified: Secondary | ICD-10-CM

## 2020-09-10 ENCOUNTER — Encounter: Payer: Self-pay | Admitting: Family Medicine

## 2020-10-01 ENCOUNTER — Telehealth: Payer: Self-pay

## 2020-10-01 ENCOUNTER — Other Ambulatory Visit: Payer: Self-pay | Admitting: Family Medicine

## 2020-10-01 DIAGNOSIS — U071 COVID-19: Secondary | ICD-10-CM

## 2020-10-01 MED ORDER — NIRMATRELVIR/RITONAVIR (PAXLOVID)TABLET: 3.0000 | ORAL_TABLET | Freq: Two times a day (BID) | ORAL | 0 refills | Status: AC

## 2020-10-01 NOTE — Telephone Encounter (Signed)
Has normal kidney function, heart disease, obesity and over 65 - should qualify for Paxlovid. Will send order to [facility]. Patient must stop his statin 12 hours before starting this medication and not restart it until 5 days after finishing the prescription.

## 2020-10-01 NOTE — Telephone Encounter (Signed)
Please advise. Patient had GFR checked on 07/08/20.

## 2020-10-01 NOTE — Telephone Encounter (Signed)
Patient reports symptoms started on 09/29/20. Patient reports cough, headache, and low grade fever. He uses [facility] pharmacy. Please advise.

## 2020-10-01 NOTE — Telephone Encounter (Signed)
Copied from [place] [id number]. Topic: General - Other >> Oct 01, 2020 11:44 AM Jodie Echevaria wrote: Reason for CRM: Patient wife [name] called in to inform Dr B that her husband tested positive for Covid this morning and they are on the way driving back from [place] but asking Dr B if she can please send in the Rx for antiviral medication to the pharmacy please so that he can get this to help him feel better and maintain.  Please advise Ph# [phone]

## 2020-10-01 NOTE — Telephone Encounter (Signed)
Pts wife was calling back to check on the status of her previous message, pt wanted to start on the antiviral medication for covid.

## 2020-10-18 DIAGNOSIS — Z20822 Contact with and (suspected) exposure to covid-19: Secondary | ICD-10-CM | POA: Diagnosis not present

## 2020-10-18 DIAGNOSIS — Z03818 Encounter for observation for suspected exposure to other biological agents ruled out: Secondary | ICD-10-CM | POA: Diagnosis not present

## 2020-10-19 ENCOUNTER — Encounter: Payer: Self-pay | Admitting: Family Medicine

## 2020-10-19 MED ORDER — ALBUTEROL SULFATE HFA 108 (90 BASE) MCG/ACT IN AERS: 2.0000 | INHALATION_SPRAY | Freq: Four times a day (QID) | RESPIRATORY_TRACT | 0 refills | Status: DC | PRN

## 2020-12-09 DIAGNOSIS — G4733 Obstructive sleep apnea (adult) (pediatric): Secondary | ICD-10-CM | POA: Diagnosis not present

## 2020-12-16 ENCOUNTER — Other Ambulatory Visit: Payer: Self-pay

## 2020-12-16 ENCOUNTER — Ambulatory Visit: Payer: BC Managed Care – PPO | Admitting: Dermatology

## 2020-12-16 DIAGNOSIS — D485 Neoplasm of uncertain behavior of skin: Secondary | ICD-10-CM | POA: Diagnosis not present

## 2020-12-16 DIAGNOSIS — L821 Other seborrheic keratosis: Secondary | ICD-10-CM

## 2020-12-16 DIAGNOSIS — L309 Dermatitis, unspecified: Secondary | ICD-10-CM

## 2020-12-16 DIAGNOSIS — D229 Melanocytic nevi, unspecified: Secondary | ICD-10-CM

## 2020-12-16 DIAGNOSIS — R202 Paresthesia of skin: Secondary | ICD-10-CM | POA: Diagnosis not present

## 2020-12-16 DIAGNOSIS — L578 Other skin changes due to chronic exposure to nonionizing radiation: Secondary | ICD-10-CM

## 2020-12-16 DIAGNOSIS — L918 Other hypertrophic disorders of the skin: Secondary | ICD-10-CM

## 2020-12-16 DIAGNOSIS — L720 Epidermal cyst: Secondary | ICD-10-CM | POA: Diagnosis not present

## 2020-12-16 DIAGNOSIS — Z1283 Encounter for screening for malignant neoplasm of skin: Secondary | ICD-10-CM

## 2020-12-16 DIAGNOSIS — L814 Other melanin hyperpigmentation: Secondary | ICD-10-CM

## 2020-12-16 DIAGNOSIS — D1800 Hemangioma unspecified site: Secondary | ICD-10-CM

## 2020-12-16 DIAGNOSIS — L723 Sebaceous cyst: Secondary | ICD-10-CM

## 2020-12-16 NOTE — Progress Notes (Signed)
Follow-Up Visit   Subjective  Michael Reyes is a 65 y.o. male who presents for the following: Annual Exam (No hx of skin cancer. ). Hx of dermatitis of the L post auricular areas and eyelids currently using Desonide to aa's QD PRN. He has also noticed a lesion on his R inner thigh that he would like checked today, and a chronic persistent itch on his L shoulder. The patient presents for Total-Body Skin Exam (TBSE) for skin cancer screening and mole check.  The following portions of the chart were reviewed this encounter and updated as appropriate:   Tobacco  Allergies  Meds  Problems  Med Hx  Surg Hx  Fam Hx     Review of Systems:  No other skin or systemic complaints except as noted in HPI or Assessment and Plan.  Objective  Well appearing patient in no apparent distress; mood and affect are within normal limits.  A full examination was performed including scalp, head, eyes, ears, nose, lips, neck, chest, axillae, abdomen, back, buttocks, bilateral upper extremities, bilateral lower extremities, hands, feet, fingers, toes, fingernails, and toenails. All findings within normal limits unless otherwise noted below.  L post auricular, eyelids Inflamed erythematous plaque.  R inner thigh 0.4 cm erythematous papule.     L post shoulder Clear.   R buttock Firm SQ nodule.   Assessment & Plan  Dermatitis L post auricular, eyelids  Contact dermatitis vs other - irritant vs allergic  Continue Desonide cream to aa's QD PRN for a maximum of 1 week to eyelids. Patient reports only using a couple of times a year.   Topical steroids (such as triamcinolone, fluocinolone, fluocinonide, mometasone, clobetasol, halobetasol, betamethasone, hydrocortisone) can cause thinning and lightening of the skin if they are used for too long in the same area. Your physician has selected the right strength medicine for your problem and area affected on the body. Please use your medication  only as directed by your physician to prevent side effects.    Neoplasm of uncertain behavior of skin R inner thigh  Epidermal / dermal shaving  Lesion diameter (cm):  0.4 Informed consent: discussed and consent obtained   Patient was prepped and draped in usual sterile fashion: area prepped with alcohol. Anesthesia: the lesion was anesthetized in a standard fashion   Anesthetic:  1% lidocaine w/ epinephrine 1-100,000 buffered w/ 8.4% NaHCO3 Instrument used: scissors   Hemostasis achieved with: pressure, aluminum chloride and electrodesiccation   Outcome: patient tolerated procedure well   Post-procedure details: wound care instructions given   Post-procedure details comment:  Ointment and bandaid applied  Specimen 1 - Surgical pathology Differential Diagnosis: D48.5 r/o irritated skin tag vs nevus vs other Check Margins: No 0.4 cm erythematous papule.  Notalgia paresthetica L post shoulder  Discussed condition with patient and advised that there is no cure only temporary treatment. Recommend Capsaicin cream up to six times per day for 5-6 weeks. Then try decreasing. If bothersome burning or stinging try mixing with Vaseline. Other options include Gold Bond Rapid Relief cream three times per day. Also discussed prescription compounded topical amitriptyline   Epidermal cyst R buttock  Benign-appearing. Exam most consistent with an epidermal inclusion cyst. Discussed that a cyst is a benign growth that can grow over time and sometimes get irritated or inflamed. Recommend observation if it is not bothersome. Discussed option of surgical excision to remove it if it is growing, symptomatic, or other changes noted. Please call for new or changing lesions  so they can be evaluated.    Lentigines - Scattered tan macules - Due to sun exposure - Benign-appering, observe - Recommend daily broad spectrum sunscreen SPF 30+ to sun-exposed areas, reapply every 2 hours as needed. - Call for  any changes  Seborrheic Keratoses - Stuck-on, waxy, tan-brown papules and/or plaques  - Benign-appearing - Discussed benign etiology and prognosis. - Observe - Call for any changes  Melanocytic Nevi - Tan-brown and/or pink-flesh-colored symmetric macules and papules - Benign appearing on exam today - Observation - Call clinic for new or changing moles - Recommend daily use of broad spectrum spf 30+ sunscreen to sun-exposed areas.   Hemangiomas - Red papules - Discussed benign nature - Observe - Call for any changes  Actinic Damage - Chronic condition, secondary to cumulative UV/sun exposure - diffuse scaly erythematous macules with underlying dyspigmentation - Recommend daily broad spectrum sunscreen SPF 30+ to sun-exposed areas, reapply every 2 hours as needed.  - Staying in the shade or wearing long sleeves, sun glasses (UVA+UVB protection) and wide brim hats (4-inch brim around the entire circumference of the hat) are also recommended for sun protection.  - Call for new or changing lesions.  Skin cancer screening performed today.  Return if symptoms worsen or fail to improve.  Luther Redo, CMA, am acting as scribe for Forest Gleason, MD . Documentation: I have reviewed the above documentation for accuracy and completeness, and I agree with the above.  Forest Gleason, MD

## 2020-12-16 NOTE — Patient Instructions (Addendum)
If you have any questions or concerns for your doctor, please call our main line at [phone] and press option 4 to reach your doctor's medical assistant. If no one answers, please leave a voicemail as directed and we will return your call as soon as possible. Messages left after 4 pm will be answered the following business day.   You may also send us a message via MyChart. We typically respond to MyChart messages within 1-2 business days.  For prescription refills, please ask your pharmacy to contact our office. Our fax number is [phone].  If you have an urgent issue when the clinic is closed that cannot wait until the next business day, you can page your doctor at the number below.    Please note that while we do our best to be available for urgent issues outside of office hours, we are not available 24/7.   If you have an urgent issue and are unable to reach us, you may choose to seek medical care at your doctor's office, retail clinic, urgent care center, or emergency room.  If you have a medical emergency, please immediately call 911 or go to the emergency department.  Pager Numbers  - Dr. Kowalski: [phone]  - Dr. Moye: [phone]  - Dr. Stewart: [phone]  In the event of inclement weather, please call our main line at [phone] for an update on the status of any delays or closures.  Dermatology Medication Tips: Please keep the boxes that topical medications come in in order to help keep track of the instructions about where and how to use these. Pharmacies typically print the medication instructions only on the boxes and not directly on the medication tubes.   If your medication is too expensive, please contact our office at [phone] option 4 or send us a message through MyChart.   We are unable to tell what your co-pay for medications will be in advance as this is different depending on your insurance coverage. However, we may be able to find a substitute  medication at lower cost or fill out paperwork to get insurance to cover a needed medication.   If a prior authorization is required to get your medication covered by your insurance company, please allow us 1-2 business days to complete this process.  Drug prices often vary depending on where the prescription is filled and some pharmacies may offer cheaper prices.  The website www.goodrx.com contains coupons for medications through different pharmacies. The prices here do not account for what the cost may be with help from insurance (it may be cheaper with your insurance), but the website can give you the price if you did not use any insurance.  - You can print the associated coupon and take it with your prescription to the pharmacy.  - You may also stop by our office during regular business hours and pick up a GoodRx coupon card.  - If you need your prescription sent electronically to a different pharmacy, notify our office through [facility] MyChart or by phone at [phone] option 4. Wound Care Instructions  Cleanse wound gently with soap and water once a day then pat dry with clean gauze. Apply a thing coat of Petrolatum (petroleum jelly, "Vaseline") over the wound (unless you have an allergy to this). We recommend that you use a new, sterile tube of Vaseline. Do not pick or remove scabs. Do not remove the yellow or white "healing tissue" from the base of the wound.  Cover the wound with   fresh, clean, nonstick gauze and secure with paper tape. You may use Band-Aids in place of gauze and tape if the would is small enough, but would recommend trimming much of the tape off as there is often too much. Sometimes Band-Aids can irritate the skin.  You should call the office for your biopsy report after 1 week if you have not already been contacted.  If you experience any problems, such as abnormal amounts of bleeding, swelling, significant bruising, significant pain, or evidence of infection,  please call the office immediately.  FOR ADULT SURGERY PATIENTS: If you need something for pain relief you may take 1 extra strength Tylenol (acetaminophen) AND 2 Ibuprofen ('200mg'$  each) together every 4 hours as needed for pain. (do not take these if you are allergic to them or if you have a reason you should not take them.) Typically, you may only need pain medication for 1 to 3 days.   Recommend taking Heliocare sun protection supplement daily in sunny weather for additional sun protection. For maximum protection on the sunniest days, you can take up to 2 capsules of regular Heliocare OR take 1 capsule of Heliocare Ultra. For prolonged exposure (such as a full day in the sun), you can repeat your dose of the supplement 4 hours after your first dose. Heliocare can be purchased at [facility] or at [email or web address].   Capsaicin six times per day for 6 weeks. If burning or stinging too uncomfortable OK to mix with Vaseline. Also recommend Gold Bond Rapid Relief cream three times day.

## 2020-12-21 ENCOUNTER — Encounter: Payer: Self-pay | Admitting: Dermatology

## 2021-01-13 ENCOUNTER — Encounter: Payer: Self-pay | Admitting: Family Medicine

## 2021-01-13 ENCOUNTER — Ambulatory Visit: Payer: BC Managed Care – PPO | Admitting: Family Medicine

## 2021-01-13 ENCOUNTER — Other Ambulatory Visit: Payer: Self-pay

## 2021-01-13 VITALS — BP 127/81 | HR 70 | Temp 97.7°F | Ht 75.0 in | Wt 286.3 lb

## 2021-01-13 DIAGNOSIS — E669 Obesity, unspecified: Secondary | ICD-10-CM

## 2021-01-13 DIAGNOSIS — Z23 Encounter for immunization: Secondary | ICD-10-CM

## 2021-01-13 DIAGNOSIS — E7800 Pure hypercholesterolemia, unspecified: Secondary | ICD-10-CM

## 2021-01-13 DIAGNOSIS — E039 Hypothyroidism, unspecified: Secondary | ICD-10-CM

## 2021-01-13 DIAGNOSIS — Z87898 Personal history of other specified conditions: Secondary | ICD-10-CM | POA: Diagnosis not present

## 2021-01-13 DIAGNOSIS — Z6835 Body mass index (BMI) 35.0-35.9, adult: Secondary | ICD-10-CM

## 2021-01-13 DIAGNOSIS — I2510 Atherosclerotic heart disease of native coronary artery without angina pectoris: Secondary | ICD-10-CM

## 2021-01-13 LAB — POCT GLYCOSYLATED HEMOGLOBIN (HGB A1C): Hemoglobin A1C: 5.5 % (ref 4.0–5.6)

## 2021-01-13 NOTE — Assessment & Plan Note (Signed)
Discussed importance of healthy weight management Discussed diet and exercise  

## 2021-01-13 NOTE — Assessment & Plan Note (Signed)
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

## 2021-01-13 NOTE — Assessment & Plan Note (Signed)
Blood sugar remains normal Ok to have carbs occasionally

## 2021-01-13 NOTE — Assessment & Plan Note (Signed)
Previously well controlled Continue Synthroid at current dose  Recheck TSH and adjust Synthroid as indicated   

## 2021-01-13 NOTE — Progress Notes (Signed)
Established patient visit   Patient: Michael Reyes   DOB: 10/15/1955   65 y.o. Male  MRN: [id number] Visit Date: 01/13/2021  Today's healthcare provider: Lavon Paganini, MD   Chief Complaint  Patient presents with   Hypothyroidism   Hyperlipidemia   Subjective    HPI   Diabetes Mellitus Type II, Follow-up  Lab Results  Component Value Date   HGBA1C 5.6 07/08/2020   HGBA1C 5.5 01/09/2020   HGBA1C 5.5 03/20/2019   Wt Readings from Last 3 Encounters:  01/13/21 286 lb 4.8 oz (129.9 kg)  07/08/20 281 lb (127.5 kg)  01/08/20 277 lb (125.6 kg)   Last seen for diabetes 6 months ago. (07/08/20)  He made lifestyle changes to improve his diet.  Management since then includes low carb diet. He reports excellent compliance with treatment.  He is not having side effects.   Symptoms: No fatigue No foot ulcerations  No appetite changes No nausea  No paresthesia of the feet  No polydipsia  No polyuria No visual disturbances   No vomiting     Home blood sugar records:  none  Episodes of hypoglycemia? No    Current insulin regiment:  Most Recent Eye Exam: UTD Current exercise: none Current diet habits: low carb  Pertinent Labs: Lab Results  Component Value Date   CHOL 152 07/08/2020   HDL 41 07/08/2020   LDLCALC 90 07/08/2020   TRIG 114 07/08/2020   CHOLHDL 3.5 03/20/2019   Lab Results  Component Value Date   NA 141 07/08/2020   K 4.5 07/08/2020   CREATININE 1.00 07/08/2020   GFRNONAA 69 01/14/2020   GFRAA 80 01/14/2020   GLUCOSE 106 (H) 07/08/2020     ---------------------------------------------------------------------------------------------------  Hypothyroid, follow-up  Lab Results  Component Value Date   TSH 4.090 07/08/2020   TSH 3.800 01/14/2020   TSH 2.930 03/20/2019   FREET4 0.77 (L) 03/08/2018   T4TOTAL 4.8 02/29/2016   Wt Readings from Last 3 Encounters:  01/13/21 286 lb 4.8 oz (129.9 kg)  07/08/20 281 lb (127.5 kg)   01/08/20 277 lb (125.6 kg)    He was last seen for hypothyroid 6 months ago.  Management since that visit includes continue current medications.  He reports good compliance with treatment. He is not having side effects.  Symptoms: No change in energy level No constipation  No diarrhea No heat / cold intolerance  No nervousness No palpitations  No weight changes    Cholesterol He is taking simvastatin for dyslipidemia and is well controlled.  Sleep apnea He has been diligent with CPAP for sleep apnea.   Heart Disease He has coronary artery disease and valvular heart disease. He continues taking a baby aspirin. He denies chest pain/pressure and dyspnea.  Flu vaccine - 01/13/21 -----------------------------------------------------------------------------------------     Medications: Outpatient Medications Prior to Visit  Medication Sig   allopurinol (ZYLOPRIM) 100 MG tablet Take 1 tablet (100 mg total) by mouth 2 (two) times daily.   aspirin 81 MG tablet Take 81 mg by mouth daily.    GLUCOSAMINE-CHONDROIT-VIT C-MN PO Take 1 tablet by mouth daily.    levothyroxine (SYNTHROID) 50 MCG tablet Take 1 tablet (50 mcg total) by mouth daily.   MULTIPLE VITAMIN PO Take 1 tablet by mouth daily.    simvastatin (ZOCOR) 40 MG tablet TAKE 1 TABLET BY MOUTH EVERY DAY   [DISCONTINUED] albuterol (VENTOLIN HFA) 108 (90 Base) MCG/ACT inhaler Inhale 2 puffs into the lungs every 6 (six)  hours as needed for wheezing or shortness of breath. (Patient not taking: No sig reported)   No facility-administered medications prior to visit.    Review of Systems  Constitutional:  Negative for chills, fatigue and fever.  HENT:  Negative for ear pain, sinus pressure, sinus pain and sore throat.   Eyes:  Negative for pain and visual disturbance.  Respiratory:  Negative for cough, chest tightness, shortness of breath and wheezing.   Cardiovascular:  Negative for chest pain, palpitations and leg swelling.   Gastrointestinal:  Negative for abdominal pain, diarrhea, nausea and vomiting.  Genitourinary:  Negative for flank pain, frequency and urgency.  Musculoskeletal:  Negative for back pain, myalgias and neck pain.  Neurological:  Negative for dizziness, weakness, light-headedness, numbness and headaches.      Objective    BP 127/81 (BP Location: Right Arm, Patient Position: Sitting, Cuff Size: Large)   Pulse 70   Temp 97.7 F (36.5 C) (Oral)   Ht '6\' 3"'$  (1.905 m)   Wt 286 lb 4.8 oz (129.9 kg)   SpO2 98%   BMI 35.79 kg/m  {Show previous vital signs (optional):23777}  Physical Exam Vitals reviewed.  Constitutional:      General: He is not in acute distress.    Appearance: Normal appearance. He is not diaphoretic.  HENT:     Head: Normocephalic and atraumatic.  Eyes:     General: No scleral icterus.    Conjunctiva/sclera: Conjunctivae normal.  Cardiovascular:     Rate and Rhythm: Normal rate and regular rhythm.     Pulses: Normal pulses.     Heart sounds: Normal heart sounds. No murmur heard. Pulmonary:     Effort: Pulmonary effort is normal. No respiratory distress.     Breath sounds: Normal breath sounds. No wheezing or rhonchi.  Abdominal:     General: There is no distension.     Palpations: Abdomen is soft.     Tenderness: There is no abdominal tenderness.  Musculoskeletal:     Cervical back: Neck supple.     Right lower leg: No edema.     Left lower leg: No edema.  Lymphadenopathy:     Cervical: No cervical adenopathy.  Skin:    General: Skin is warm and dry.     Capillary Refill: Capillary refill takes less than 2 seconds.     Findings: No rash.  Neurological:     Mental Status: He is alert and oriented to person, place, and time.     Cranial Nerves: No cranial nerve deficit.  Psychiatric:        Mood and Affect: Mood normal.        Behavior: Behavior normal.      No results found for any visits on 01/13/21.  Assessment & Plan     Problem List Items  Addressed This Visit       Cardiovascular and Mediastinum   Coronary artery disease involving native coronary artery of native heart without angina pectoris    Previously f/b Cardiology Had normal stress test, but 50% occlusion to 1 artery on MRI around 2007 Continue statin and ASA Risk factor modification        Endocrine   Hypothyroidism - Primary    Previously well controlled Continue Synthroid at current dose  Recheck TSH and adjust Synthroid as indicated        Relevant Orders   TSH   History of prediabetes    Blood sugar remains normal Ok to have carbs occasionally  Relevant Orders   POCT HgB A1C   Comprehensive metabolic panel     Other   Hypercholesteremia    Previously well controlled Continue statin Repeat FLP and CMP at CPE      Obesity    Discussed importance of healthy weight management Discussed diet and exercise       Other Visit Diagnoses     Flu vaccine need       Relevant Orders   Flu Vaccine QUAD High Dose(Fluad) (Completed)        Return in about 6 months (around 07/13/2021) for CPE.      I,Essence Turner,acting as a Education administrator for Lavon Paganini, MD.,have documented all relevant documentation on the behalf of Lavon Paganini, MD,as directed by  Lavon Paganini, MD while in the presence of Lavon Paganini, MD.  I, Lavon Paganini, MD, have reviewed all documentation for this visit. The documentation on 01/13/21 for the exam, diagnosis, procedures, and orders are all accurate and complete.   Leyana Whidden, Dionne Bucy, MD, MPH [facility]

## 2021-01-13 NOTE — Assessment & Plan Note (Signed)
Previously well controlled Continue statin Repeat FLP and CMP at CPE   

## 2021-01-14 LAB — COMPREHENSIVE METABOLIC PANEL
ALT: 42 IU/L (ref 0–44)
AST: 35 IU/L (ref 0–40)
Albumin/Globulin Ratio: 1.7 (ref 1.2–2.2)
Albumin: 4.7 g/dL (ref 3.8–4.8)
Alkaline Phosphatase: 85 IU/L (ref 44–121)
BUN/Creatinine Ratio: 19 (ref 10–24)
BUN: 21 mg/dL (ref 8–27)
Bilirubin Total: 0.7 mg/dL (ref 0.0–1.2)
CO2: 25 mmol/L (ref 20–29)
Calcium: 9.8 mg/dL (ref 8.6–10.2)
Chloride: 103 mmol/L (ref 96–106)
Creatinine, Ser: 1.09 mg/dL (ref 0.76–1.27)
Globulin, Total: 2.8 g/dL (ref 1.5–4.5)
Glucose: 109 mg/dL — ABNORMAL HIGH (ref 65–99)
Potassium: 4.4 mmol/L (ref 3.5–5.2)
Sodium: 143 mmol/L (ref 134–144)
Total Protein: 7.5 g/dL (ref 6.0–8.5)
eGFR: 75 mL/min/{1.73_m2} (ref >59)

## 2021-01-14 LAB — TSH: TSH: 2.74 u[IU]/mL (ref 0.450–4.500)

## 2021-01-15 ENCOUNTER — Encounter: Payer: Self-pay | Admitting: Family Medicine

## 2021-04-06 DIAGNOSIS — H52223 Regular astigmatism, bilateral: Secondary | ICD-10-CM | POA: Diagnosis not present

## 2021-04-06 DIAGNOSIS — Z9842 Cataract extraction status, left eye: Secondary | ICD-10-CM | POA: Diagnosis not present

## 2021-04-06 DIAGNOSIS — H59811 Chorioretinal scars after surgery for detachment, right eye: Secondary | ICD-10-CM | POA: Diagnosis not present

## 2021-04-06 DIAGNOSIS — Z9841 Cataract extraction status, right eye: Secondary | ICD-10-CM | POA: Diagnosis not present

## 2021-04-07 ENCOUNTER — Other Ambulatory Visit: Payer: Self-pay | Admitting: Family Medicine

## 2021-05-21 ENCOUNTER — Other Ambulatory Visit: Payer: Self-pay | Admitting: Family Medicine

## 2021-05-21 NOTE — Telephone Encounter (Signed)
Requested Prescriptions  Pending Prescriptions Disp Refills   levothyroxine (SYNTHROID) 50 MCG tablet [Pharmacy Med Name: LEVOTHYROXINE 0.05MG  (50MCG) TAB] 90 tablet 2    Sig: TAKE 1 TABLET(50 MCG) BY MOUTH DAILY     Endocrinology:  Hypothyroid Agents Failed - 05/21/2021 12:01 PM      Failed - TSH needs to be rechecked within 3 months after an abnormal result. Refill until TSH is due.      Passed - TSH in normal range and within 360 days    TSH  Date Value Ref Range Status  01/13/2021 2.740 0.450 - 4.500 uIU/mL Final         Passed - Valid encounter within last 12 months    Recent Outpatient Visits          4 months ago Acquired hypothyroidism   [facility], Dionne Bucy, MD   10 months ago Encounter for annual physical exam   [facility], Dionne Bucy, MD   1 year ago [facility], Dionne Bucy, MD   2 years ago Encounter for annual physical exam   [facility], Dionne Bucy, MD   2 years ago [facility], Dionne Bucy, MD      Future Appointments            In 1 month [place], Dionne Bucy, MD [facility], [place]

## 2021-07-14 ENCOUNTER — Encounter: Payer: BC Managed Care – PPO | Admitting: Family Medicine

## 2021-08-02 ENCOUNTER — Encounter: Payer: Self-pay | Admitting: Family Medicine

## 2021-08-02 ENCOUNTER — Telehealth: Payer: Self-pay

## 2021-08-02 ENCOUNTER — Other Ambulatory Visit: Payer: Self-pay

## 2021-08-02 ENCOUNTER — Ambulatory Visit (INDEPENDENT_AMBULATORY_CARE_PROVIDER_SITE_OTHER): Payer: BC Managed Care – PPO | Admitting: Family Medicine

## 2021-08-02 VITALS — BP 118/82 | HR 78 | Temp 98.0°F | Resp 16 | Ht 75.0 in | Wt 293.6 lb

## 2021-08-02 DIAGNOSIS — E669 Obesity, unspecified: Secondary | ICD-10-CM

## 2021-08-02 DIAGNOSIS — Z23 Encounter for immunization: Secondary | ICD-10-CM

## 2021-08-02 DIAGNOSIS — E039 Hypothyroidism, unspecified: Secondary | ICD-10-CM

## 2021-08-02 DIAGNOSIS — I2510 Atherosclerotic heart disease of native coronary artery without angina pectoris: Secondary | ICD-10-CM

## 2021-08-02 DIAGNOSIS — Z0000 Encounter for general adult medical examination without abnormal findings: Secondary | ICD-10-CM | POA: Diagnosis not present

## 2021-08-02 DIAGNOSIS — Z87898 Personal history of other specified conditions: Secondary | ICD-10-CM

## 2021-08-02 DIAGNOSIS — Z6836 Body mass index (BMI) 36.0-36.9, adult: Secondary | ICD-10-CM | POA: Diagnosis not present

## 2021-08-02 DIAGNOSIS — M109 Gout, unspecified: Secondary | ICD-10-CM

## 2021-08-02 DIAGNOSIS — Z86010 Personal history of colonic polyps: Secondary | ICD-10-CM

## 2021-08-02 DIAGNOSIS — Z1211 Encounter for screening for malignant neoplasm of colon: Secondary | ICD-10-CM

## 2021-08-02 DIAGNOSIS — Z125 Encounter for screening for malignant neoplasm of prostate: Secondary | ICD-10-CM

## 2021-08-02 DIAGNOSIS — E7800 Pure hypercholesterolemia, unspecified: Secondary | ICD-10-CM

## 2021-08-02 MED ORDER — NA SULFATE-K SULFATE-MG SULF 17.5-3.13-1.6 GM/177ML PO SOLN: 1.0000 | Freq: Once | ORAL | 0 refills | Status: AC

## 2021-08-02 NOTE — Progress Notes (Signed)
Reviewed records brought in by patient from previous cardiology work up. ? ?Patient was seen by Dr. Fletcher Anon in 2010.  He had a borderline abnormal stress test.  This showed no evidence of perfusion defects.  However, strongly positive EKG changes with exercise.  Normal LV systolic function.  No transient ischemic dilation.  He was able to exercise for 6 minutes and 30 seconds.  He had dyspnea at the end, but no chest pain.  There was 1.5 mm horizontal ST depression in inferior and inferior lateral leads which is diagnostic for ischemia.  Moderate risk Duke treadmill score of -3. ? ?He was initially scheduled for cardiac catheterization, but was hesitant and underwent coronary CTA instead.  This was done at [facility] and showed calcified plaque in the mid LAD with less than 50% stenosis. ? ?Cardiology recommended aggressive medical therapy and lifestyle changes with a repeat stress test in 2 years to monitor LAD stenosis and physiologic significance.  He should continue daily baby aspirin. ? ?He also had an echocardiogram that showed normal chamber size, normal LV systolic function.  Normal wall motion.  Normal diastolic function.  Trace pulmonary regurgitation.  Trace mitral regurgitation.  Trace tricuspid regurgitation.  Normal pulmonary artery systolic pressure. ? ?Records will be scanned into chart for future reference. ? ?Virginia Crews, MD, MPH ?[place] ?[facility]  ?

## 2021-08-02 NOTE — Telephone Encounter (Signed)
Patient returned call. Requesting call back to schedule appt. ?

## 2021-08-02 NOTE — Assessment & Plan Note (Signed)
Reviewed records from cardiology ?No longer seeing them ?We discussed symptoms like DOE that would warrant reevaluation ?Continue statin and aspirin ?Aggressive risk factor modification ?

## 2021-08-02 NOTE — Assessment & Plan Note (Signed)
Discussed importance of healthy weight management Discussed diet and exercise  

## 2021-08-02 NOTE — Assessment & Plan Note (Signed)
No recent flares ?Continue allopurinol ?Recheck uric acid level ?Goal less than 6 ?

## 2021-08-02 NOTE — Assessment & Plan Note (Signed)
Previously well controlled Continue statin Repeat FLP and CMP Goal LDL < 70 

## 2021-08-02 NOTE — Assessment & Plan Note (Signed)
Previously well controlled Continue Synthroid at current dose  Recheck TSH and adjust Synthroid as indicated   

## 2021-08-02 NOTE — Assessment & Plan Note (Signed)
Recommend low carb diet °Recheck A1c  °

## 2021-08-02 NOTE — Telephone Encounter (Signed)
CALLED PATIENT NO ANSWER LEFT VOICEMAIL FOR A CALL BACK ? ?

## 2021-08-02 NOTE — Progress Notes (Unsigned)
Gastroenterology Pre-Procedure Review ? ?Request Date: 04/11/2022 ?Requesting Physician: Dr. Vicente Males ? ?PATIENT REVIEW QUESTIONS: The patient responded to the following health history questions as indicated:   ? ?1. Are you having any GI issues? no ?2. Do you have a personal history of Polyps? yes (LAST COLONOSCOPY) ?3. Do you have a family history of Colon Cancer or Polyps? no ?4. Diabetes Mellitus? no ?5. Joint replacements in the past 12 months?no ?6. Major health problems in the past 3 months?no ?7. Any artificial heart valves, MVP, or defibrillator?no ?   ?MEDICATIONS & ALLERGIES:    ?Patient reports the following regarding taking any anticoagulation/antiplatelet therapy:   ?Plavix, Coumadin, Eliquis, Xarelto, Lovenox, Pradaxa, Brilinta, or Effient? no ?Aspirin? yes (81 MG) ? ?Patient confirms/reports the following medications:  ?Current Outpatient Medications  ?Medication Sig Dispense Refill  ? allopurinol (ZYLOPRIM) 100 MG tablet TAKE 1 TABLET(100 MG) BY MOUTH TWICE DAILY 180 tablet 3  ? aspirin 81 MG tablet Take 81 mg by mouth daily.     ? GLUCOSAMINE-CHONDROIT-VIT C-MN PO Take 1 tablet by mouth daily.     ? levothyroxine (SYNTHROID) 50 MCG tablet TAKE 1 TABLET(50 MCG) BY MOUTH DAILY 90 tablet 2  ? MULTIPLE VITAMIN PO Take 1 tablet by mouth daily.     ? simvastatin (ZOCOR) 40 MG tablet TAKE 1 TABLET BY MOUTH EVERY DAY 90 tablet 3  ? ?No current facility-administered medications for this visit.  ? ? ?Patient confirms/reports the following allergies:  ?No Known Allergies ? ?No orders of the defined types were placed in this encounter. ? ? ?AUTHORIZATION INFORMATION ?Primary Insurance: ?1D#: ?Group #: ? ?Secondary Insurance: ?1D#: ?Group #: ? ?SCHEDULE INFORMATION: ?Date: 04/11/2022 ?Time: ?[place] ? ?

## 2021-08-02 NOTE — Progress Notes (Addendum)
I,[name],acting as a Neurosurgeon for Shirlee Latch, MD.,have documented all relevant documentation on the behalf of Shirlee Latch, MD,as directed by  Shirlee Latch, MD while in the presence of Shirlee Latch, MD.   Complete physical exam   Patient: Michael Reyes   DOB: [birth year]   66 y.o. Male  MRN: [id number] Visit Date: 08/02/2021  Today's healthcare provider: Shirlee Latch, MD   Chief Complaint  Patient presents with   Annual Exam   Subjective    [name] is a 66 y.o. male who presents today for a complete physical exam.  He reports consuming a diabetic, low calorie and keto  diet. The patient does not participate in regular exercise at present. He generally feels well. He reports sleeping well. He does have additional problems to discuss today.  HPI  Patient reports his left little toe may be broken. Patient reports he bent toe to hard and he heard it snap. He reports this happened about a month ago.   Having intermittent thoracic back pain - no radiation.   Having urinary urgency at times.  Drinking a lot of fluids and does urinate frequently. No nocturia. +dribbling. No hesitancy.    History reviewed. No pertinent past medical history. Past Surgical History:  Procedure Laterality Date   COLONOSCOPY  2012   FINGER SURGERY Left 01/2016   cyst removal    KNEE SURGERY Right 12/2013   VASECTOMY     Social History   Socioeconomic History   Marital status: Married    Spouse name: Not on file   Number of children: 2   Years of education: PHD   Highest education level: Not on file  Occupational History   Occupation: Professor  Tobacco Use   Smoking status: Never   Smokeless tobacco: Never  Vaping Use   Vaping Use: Never used  Substance and Sexual Activity   Alcohol use: Yes    Alcohol/week: 0.0 standard drinks    Comment: Occasionally   Drug use: No   Sexual activity: Yes  Other Topics Concern   Not on file   Social History Narrative   Not on file   Social Determinants of Health   Financial Resource Strain: Not on file  Food Insecurity: Not on file  Transportation Needs: Not on file  Physical Activity: Not on file  Stress: Not on file  Social Connections: Not on file  Intimate Partner Violence: Not on file   Family Status  Relation Name Status   Mother  Deceased   Father  Deceased at age 85's   Sister  Deceased   Brother  Alive   Family History  Problem Relation Age of Onset   Tongue cancer Mother    Heart disease Father    Diabetes Father    Obesity Sister    Cancer Sister    Diabetes Sister    Healthy Brother    No Known Allergies  Patient Care Team: Michael Downer, MD as PCP - General (Family Medicine)   Medications: Outpatient Medications Prior to Visit  Medication Sig   allopurinol (ZYLOPRIM) 100 MG tablet TAKE 1 TABLET(100 MG) BY MOUTH TWICE DAILY   aspirin 81 MG tablet Take 81 mg by mouth daily.    GLUCOSAMINE-CHONDROIT-VIT C-MN PO Take 1 tablet by mouth daily.    levothyroxine (SYNTHROID) 50 MCG tablet TAKE 1 TABLET(50 MCG) BY MOUTH DAILY   MULTIPLE VITAMIN PO Take 1 tablet by mouth daily.    simvastatin (ZOCOR) 40  MG tablet TAKE 1 TABLET BY MOUTH EVERY DAY   No facility-administered medications prior to visit.    Review of Systems  Respiratory:  Positive for apnea.   Genitourinary:  Positive for urgency.  Musculoskeletal:  Positive for arthralgias and back pain.  All other systems reviewed and are negative.  Last CBC Lab Results  Component Value Date   WBC 4.0 03/20/2019   HGB 17.3 03/20/2019   HCT 51.5 (H) 03/20/2019   MCV 96 03/20/2019   MCH 32.3 03/20/2019   RDW 12.4 03/20/2019   PLT 215 03/20/2019   Last metabolic panel Lab Results  Component Value Date   GLUCOSE 109 (H) 01/13/2021   NA 143 01/13/2021   K 4.4 01/13/2021   CL 103 01/13/2021   CO2 25 01/13/2021   BUN 21 01/13/2021   CREATININE 1.09 01/13/2021   EGFR 75 01/13/2021    CALCIUM 9.8 01/13/2021   PROT 7.5 01/13/2021   ALBUMIN 4.7 01/13/2021   LABGLOB 2.8 01/13/2021   AGRATIO 1.7 01/13/2021   BILITOT 0.7 01/13/2021   ALKPHOS 85 01/13/2021   AST 35 01/13/2021   ALT 42 01/13/2021   Last lipids Lab Results  Component Value Date   CHOL 152 07/08/2020   HDL 41 07/08/2020   LDLCALC 90 07/08/2020   TRIG 114 07/08/2020   CHOLHDL 3.5 03/20/2019   Last hemoglobin A1c Lab Results  Component Value Date   HGBA1C 5.5 01/13/2021   Last thyroid functions Lab Results  Component Value Date   TSH 2.740 01/13/2021   T4TOTAL 4.8 02/29/2016      Objective    BP 118/82 (BP Location: Right Arm, Patient Position: Sitting, Cuff Size: Large)   Pulse 78   Temp 98 F (36.7 C) (Temporal)   Resp 16   Ht 6\' 3"  (1.905 m)   Wt 293 lb 9.6 oz (133.2 kg)   BMI 36.70 kg/m  BP Readings from Last 3 Encounters:  08/02/21 118/82  01/13/21 127/81  07/08/20 122/76   Wt Readings from Last 3 Encounters:  08/02/21 293 lb 9.6 oz (133.2 kg)  01/13/21 286 lb 4.8 oz (129.9 kg)  07/08/20 281 lb (127.5 kg)      Physical Exam Vitals reviewed.  Constitutional:      General: He is not in acute distress.    Appearance: Normal appearance. He is well-developed. He is not diaphoretic.  HENT:     Head: Normocephalic and atraumatic.     Right Ear: Tympanic membrane, ear canal and external ear normal.     Left Ear: Tympanic membrane, ear canal and external ear normal.     Nose: Nose normal.     Mouth/Throat:     Mouth: Mucous membranes are moist.     Pharynx: Oropharynx is clear. No oropharyngeal exudate.  Eyes:     General: No scleral icterus.    Conjunctiva/sclera: Conjunctivae normal.     Pupils: Pupils are equal, round, and reactive to light.  Neck:     Thyroid: No thyromegaly.  Cardiovascular:     Rate and Rhythm: Normal rate and regular rhythm.     Pulses: Normal pulses.     Heart sounds: Normal heart sounds. No murmur heard. Pulmonary:     Effort: Pulmonary  effort is normal. No respiratory distress.     Breath sounds: Normal breath sounds. No wheezing or rales.  Abdominal:     General: There is no distension.     Palpations: Abdomen is soft.     Tenderness:  There is no abdominal tenderness.  Musculoskeletal:        General: No deformity.     Cervical back: Neck supple.     Right lower leg: No edema.     Left lower leg: No edema.  Lymphadenopathy:     Cervical: No cervical adenopathy.  Skin:    General: Skin is warm and dry.     Findings: No rash.  Neurological:     Mental Status: He is alert and oriented to person, place, and time. Mental status is at baseline.     Sensory: No sensory deficit.     Motor: No weakness.     Gait: Gait normal.  Psychiatric:        Mood and Affect: Mood normal.        Behavior: Behavior normal.        Thought Content: Thought content normal.      Last depression screening scores    08/02/2021   10:52 AM 01/13/2021   10:07 AM 07/08/2020    9:09 AM  PHQ 2/9 Scores  PHQ - 2 Score 0 0 0  PHQ- 9 Score 0 0 0   Last fall risk screening    08/02/2021   10:52 AM  Fall Risk   Falls in the past year? 0  Number falls in past yr: 0  Injury with Fall? 0  Risk for fall due to : No Fall Risks  Follow up Falls evaluation completed   Last Audit-C alcohol use screening    08/02/2021   10:51 AM  Alcohol Use Disorder Test (AUDIT)  1. How often do you have a drink containing alcohol? 2  2. How many drinks containing alcohol do you have on a typical day when you are drinking? 0  3. How often do you have six or more drinks on one occasion? 0  AUDIT-C Score 2   A score of 3 or more in women, and 4 or more in men indicates increased risk for alcohol abuse, EXCEPT if all of the points are from question 1   No results found for any visits on 08/02/21.  Assessment & Plan    Routine Health Maintenance and Physical Exam  Exercise Activities and Dietary recommendations  Goals   None     Immunization History   Administered Date(s) Administered   Fluad Quad(high Dose 65+) 01/13/2021   Hepatitis A 04/06/2010, 12/09/2010   Hepatitis B, adult 03/28/2016, 05/16/2016, 09/27/2016   Influenza, High Dose Seasonal PF 01/08/2020   Influenza,inj,Quad PF,6+ Mos 02/11/2019   Influenza-Unspecified 01/30/2015, 01/09/2017, 02/19/2018   MMR 03/11/2018   PFIZER(Purple Top)SARS-COV-2 Vaccination 06/18/2019, 07/09/2019, 02/04/2020, 09/10/2020, 01/15/2021   PNEUMOCOCCAL CONJUGATE-20 08/02/2021   Pneumococcal Polysaccharide-23 01/08/2020   Tdap 11/18/2013   Zoster Recombinat (Shingrix) 03/11/2018, 06/11/2018    Health Maintenance  Topic Date Due   COVID-19 Vaccine (6 - Booster for Pfizer series) 03/12/2021   INFLUENZA VACCINE  11/29/2021   COLONOSCOPY (Pts 45-79yrs Insurance coverage will need to be confirmed)  04/18/2022   TETANUS/TDAP  11/19/2023   Pneumonia Vaccine 59+ Years old  Completed   Hepatitis C Screening  Completed   Zoster Vaccines- Shingrix  Completed   HPV VACCINES  Aged Out    Discussed health benefits of physical activity, and encouraged him to engage in regular exercise appropriate for his age and condition.  Problem List Items Addressed This Visit       Cardiovascular and Mediastinum   Coronary artery disease involving native coronary artery of  native heart without angina pectoris    Reviewed records from cardiology No longer seeing them We discussed symptoms like DOE that would warrant reevaluation Continue statin and aspirin Aggressive risk factor modification      Relevant Orders   Comprehensive metabolic panel     Endocrine   Hypothyroidism    Previously well controlled Continue Synthroid at current dose  Recheck TSH and adjust Synthroid as indicated        Relevant Orders   TSH   History of prediabetes    Recommend low carb diet Recheck A1c       Relevant Orders   Hemoglobin A1c     Musculoskeletal and Integument   Arthritis due to gout    No recent  flares Continue allopurinol Recheck uric acid level Goal less than 6      Relevant Orders   Uric acid     Other   Hypercholesteremia    Previously well controlled Continue statin Repeat FLP and CMP Goal LDL < 70      Relevant Orders   Lipid Panel With LDL/HDL Ratio   Obesity    Discussed importance of healthy weight management Discussed diet and exercise       Relevant Orders   Comprehensive metabolic panel   Other Visit Diagnoses     Encounter for annual physical exam    -  Primary   Relevant Orders   Comprehensive metabolic panel   Lipid Panel With LDL/HDL Ratio   Hemoglobin A1c   Pneumococcal conjugate vaccine 20-valent (Completed)   Need for pneumococcal 20-valent conjugate vaccination       Relevant Orders   Pneumococcal conjugate vaccine 20-valent (Completed)   Screening for prostate cancer       Relevant Orders   PSA Total (Reflex To Free)   Screen for colon cancer       Relevant Orders   Ambulatory referral to Gastroenterology        Return in about 1 year (around 08/03/2022) for CPE.     I, Shirlee Latch, MD, have reviewed all documentation for this visit. The documentation on 08/02/21 for the exam, diagnosis, procedures, and orders are all accurate and complete.   Orah Sonnen, Marzella Schlein, MD, MPH [facility]

## 2021-08-02 NOTE — Patient Instructions (Addendum)
Consider COVID bivalent booster ? ?

## 2021-08-03 DIAGNOSIS — G4733 Obstructive sleep apnea (adult) (pediatric): Secondary | ICD-10-CM | POA: Diagnosis not present

## 2021-08-03 LAB — LIPID PANEL WITH LDL/HDL RATIO
Cholesterol, Total: 165 mg/dL (ref 100–199)
HDL: 45 mg/dL (ref >39)
LDL Chol Calc (NIH): 89 mg/dL (ref 0–99)
LDL/HDL Ratio: 2 ratio (ref 0.0–3.6)
Triglycerides: 178 mg/dL — ABNORMAL HIGH (ref 0–149)
VLDL Cholesterol Cal: 31 mg/dL (ref 5–40)

## 2021-08-03 LAB — COMPREHENSIVE METABOLIC PANEL
ALT: 33 IU/L (ref 0–44)
AST: 27 IU/L (ref 0–40)
Albumin/Globulin Ratio: 1.8 (ref 1.2–2.2)
Albumin: 4.7 g/dL (ref 3.8–4.8)
Alkaline Phosphatase: 87 IU/L (ref 44–121)
BUN/Creatinine Ratio: 20 (ref 10–24)
BUN: 21 mg/dL (ref 8–27)
Bilirubin Total: 0.7 mg/dL (ref 0.0–1.2)
CO2: 24 mmol/L (ref 20–29)
Calcium: 9.7 mg/dL (ref 8.6–10.2)
Chloride: 102 mmol/L (ref 96–106)
Creatinine, Ser: 1.04 mg/dL (ref 0.76–1.27)
Globulin, Total: 2.6 g/dL (ref 1.5–4.5)
Glucose: 103 mg/dL — ABNORMAL HIGH (ref 70–99)
Potassium: 4.3 mmol/L (ref 3.5–5.2)
Sodium: 142 mmol/L (ref 134–144)
Total Protein: 7.3 g/dL (ref 6.0–8.5)
eGFR: 79 mL/min/{1.73_m2} (ref >59)

## 2021-08-03 LAB — HEMOGLOBIN A1C
Est. average glucose Bld gHb Est-mCnc: 114 mg/dL
Hgb A1c MFr Bld: 5.6 % (ref 4.8–5.6)

## 2021-08-03 LAB — PSA TOTAL (REFLEX TO FREE): Prostate Specific Ag, Serum: 1 ng/mL (ref 0.0–4.0)

## 2021-08-03 LAB — URIC ACID: Uric Acid: 4.8 mg/dL (ref 3.8–8.4)

## 2021-08-03 LAB — TSH: TSH: 3.13 u[IU]/mL (ref 0.450–4.500)

## 2021-08-15 ENCOUNTER — Telehealth: Payer: Self-pay | Admitting: Family Medicine

## 2021-08-15 MED ORDER — LEVOTHYROXINE SODIUM 50 MCG PO TABS: ORAL_TABLET | ORAL | 2 refills | Status: DC

## 2021-08-15 NOTE — Telephone Encounter (Signed)
[facility] faxed refill request for the following medications: ? ?levothyroxine (SYNTHROID) 50 MCG tablet  ? ?Please advise. ? ?

## 2021-09-19 ENCOUNTER — Encounter: Payer: Self-pay | Admitting: Family Medicine

## 2021-09-19 ENCOUNTER — Ambulatory Visit (INDEPENDENT_AMBULATORY_CARE_PROVIDER_SITE_OTHER): Payer: BC Managed Care – PPO | Admitting: Family Medicine

## 2021-09-19 VITALS — BP 115/76 | HR 77 | Temp 97.9°F | Wt 288.3 lb

## 2021-09-19 DIAGNOSIS — J189 Pneumonia, unspecified organism: Secondary | ICD-10-CM

## 2021-09-19 MED ORDER — AZITHROMYCIN 250 MG PO TABS: ORAL_TABLET | ORAL | 0 refills | Status: DC

## 2021-09-19 MED ORDER — GUAIFENESIN-CODEINE 100-10 MG/5ML PO SOLN: 5.0000 mL | Freq: Three times a day (TID) | ORAL | 0 refills | Status: DC | PRN

## 2021-09-19 NOTE — Progress Notes (Signed)
I,[name],acting as a Education administrator for Lavon Paganini, MD.,have documented all relevant documentation on the behalf of Lavon Paganini, MD,as directed by  Lavon Paganini, MD while in the presence of Lavon Paganini, MD.   Established patient visit   Patient: Michael Reyes   DOB: Jun 05, 1955   66 y.o. Male  MRN: [id number] Visit Date: 09/19/2021  Today's healthcare provider: Lavon Paganini, MD   Chief Complaint  Patient presents with   Cough   Subjective    Cough Associated symptoms include rhinorrhea. Pertinent negatives include no chest pain, chills, ear pain, fever, shortness of breath or wheezing.   Patient here today C/O persistent cough x 4-5 weeks. Patient denies fever, is coughing up some mucus. Patient reports cough is mostly dry.   Expectorant was helpful  Tried omeprazole x1 wk Worse in morning and evenings, especially with laying flat.  Medications: Outpatient Medications Prior to Visit  Medication Sig   allopurinol (ZYLOPRIM) 100 MG tablet TAKE 1 TABLET(100 MG) BY MOUTH TWICE DAILY   aspirin 81 MG tablet Take 81 mg by mouth daily.    GLUCOSAMINE-CHONDROIT-VIT C-MN PO Take 1 tablet by mouth daily.    levothyroxine (SYNTHROID) 50 MCG tablet TAKE 1 TABLET(50 MCG) BY MOUTH DAILY   MULTIPLE VITAMIN PO Take 1 tablet by mouth daily.    OMEPRAZOLE PO Take by mouth.   simvastatin (ZOCOR) 40 MG tablet TAKE 1 TABLET BY MOUTH EVERY DAY   No facility-administered medications prior to visit.    Review of Systems  Constitutional:  Negative for chills and fever.  HENT:  Positive for rhinorrhea. Negative for congestion, ear discharge, ear pain, sinus pressure and sinus pain.   Respiratory:  Positive for cough. Negative for shortness of breath and wheezing.   Cardiovascular:  Negative for chest pain.      Objective    BP 115/76 (BP Location: Left Arm, Patient Position: Sitting, Cuff Size: Large)   Pulse 77   Temp 97.9 F (36.6 C) (Oral)   Wt  288 lb 4.8 oz (130.8 kg)   SpO2 93%   BMI 36.04 kg/m    Physical Exam Vitals reviewed.  Constitutional:      General: He is not in acute distress.    Appearance: Normal appearance. He is not diaphoretic.  HENT:     Head: Normocephalic and atraumatic.  Eyes:     General: No scleral icterus.    Conjunctiva/sclera: Conjunctivae normal.  Cardiovascular:     Rate and Rhythm: Normal rate and regular rhythm.     Pulses: Normal pulses.     Heart sounds: Normal heart sounds. No murmur heard. Pulmonary:     Effort: Pulmonary effort is normal. No respiratory distress.     Breath sounds: Rhonchi (b/l bases) present. No wheezing.  Musculoskeletal:     Cervical back: Neck supple.     Right lower leg: No edema.     Left lower leg: No edema.  Lymphadenopathy:     Cervical: No cervical adenopathy.  Skin:    General: Skin is warm and dry.     Capillary Refill: Capillary refill takes less than 2 seconds.     Findings: No rash.  Neurological:     Mental Status: He is alert and oriented to person, place, and time.  Psychiatric:        Mood and Affect: Mood normal.        Behavior: Behavior normal.      No results found for any visits  on 09/19/21.  Assessment & Plan     1. Atypical pneumonia - cough x4-5 weeks with no improvement with OTC remedies or PPI - sounds wet and rhonchorus in b/l bases consistent with atypical pneumonia - he is well appearing with normal VSs, so doubt lobar pneumonia - no CXR at this time, but will reconsider if not improving - treat with Azithro x5d - continue cough suppressants and expectorants   Meds ordered this encounter  Medications   azithromycin (ZITHROMAX) 250 MG tablet    Sig: Take '500mg'$  PO daily x1d and then '250mg'$  daily x4 days    Dispense:  6 each    Refill:  0   guaiFENesin-codeine 100-10 MG/5ML syrup    Sig: Take 5 mLs by mouth 3 (three) times daily as needed for cough.    Dispense:  120 mL    Refill:  0     Return if symptoms worsen  or fail to improve.      I, Lavon Paganini, MD, have reviewed all documentation for this visit. The documentation on 09/19/21 for the exam, diagnosis, procedures, and orders are all accurate and complete.   Terrel Manalo, Dionne Bucy, MD, MPH [facility]

## 2021-09-27 ENCOUNTER — Encounter: Payer: Self-pay | Admitting: Family Medicine

## 2021-09-28 ENCOUNTER — Encounter: Payer: Self-pay | Admitting: Family Medicine

## 2021-10-06 ENCOUNTER — Ambulatory Visit: Payer: Self-pay | Admitting: *Deleted

## 2021-10-06 ENCOUNTER — Encounter: Payer: Self-pay | Admitting: Physician Assistant

## 2021-10-06 ENCOUNTER — Ambulatory Visit
Admission: RE | Admit: 2021-10-06 | Discharge: 2021-10-06 | Disposition: A | Discharge status: Home / Self Care | Payer: BC Managed Care – PPO | Source: Ambulatory Visit | Attending: Physician Assistant | Admitting: Physician Assistant

## 2021-10-06 ENCOUNTER — Ambulatory Visit
Admission: RE | Admit: 2021-10-06 | Discharge: 2021-10-06 | Disposition: A | Discharge status: Home / Self Care | Payer: BC Managed Care – PPO | Attending: Physician Assistant | Admitting: Physician Assistant

## 2021-10-06 ENCOUNTER — Ambulatory Visit: Payer: BC Managed Care – PPO | Admitting: Physician Assistant

## 2021-10-06 VITALS — BP 120/76 | HR 82 | Temp 97.8°F | Ht 75.0 in | Wt 288.9 lb

## 2021-10-06 DIAGNOSIS — R053 Chronic cough: Secondary | ICD-10-CM

## 2021-10-06 DIAGNOSIS — S99929A Unspecified injury of unspecified foot, initial encounter: Secondary | ICD-10-CM | POA: Diagnosis not present

## 2021-10-06 DIAGNOSIS — J189 Pneumonia, unspecified organism: Secondary | ICD-10-CM | POA: Diagnosis not present

## 2021-10-06 DIAGNOSIS — S92512A Displaced fracture of proximal phalanx of left lesser toe(s), initial encounter for closed fracture: Secondary | ICD-10-CM | POA: Diagnosis not present

## 2021-10-06 NOTE — Telephone Encounter (Signed)
  Chief Complaint: Toe injury Symptoms: Left foot second toe. Slipped on rock. Bruising. 3-4/10 pain Frequency: yesterday Pertinent Negatives: Patient denies deformity, lacerations.  Disposition: '[]'$ ED /'[]'$ Urgent Care (no appt availability in office) / '[x]'$ Appointment(In office/virtual)/ '[]'$  [facility]/ '[]'$ Home Care/ '[]'$ Refused Recommended Disposition /'[]'$ [facility]/ '[]'$  Follow-up with PCP Additional Notes: Appt secured. Pt wishes to be seen "So I don't have problems later on with it." Reason for Disposition  [1] Toe injury AND [2] bad limp or can't wear shoes/sandals  Answer Assessment - Initial Assessment Questions 1. MECHANISM: "How did the injury happen?"      Slipped on rock while fishing 2. ONSET: "When did the injury happen?" (Minutes or hours ago)      Yesterday 3. LOCATION: "What part of the toe is injured?" "Is the nail damaged?"      2nd toe on left foot 4. APPEARANCE of TOE INJURY: "What does the injury look like?"      Bruised under toe, entire toe bruised 5. SEVERITY: "Can you use the foot normally?" "Can you walk?"      Limping 6. SIZE: For cuts, bruises, or swelling, ask: "How large is it?" (e.g., inches or centimeters;  entire toe)      no 7. PAIN: "Is there pain?" If Yes, ask: "How bad is the pain?"   (e.g., Scale 1-10; or mild, moderate, severe)     With walking 3-4/10 8. TETANUS: For any breaks in the skin, ask: "When was the last tetanus booster?"     NA 9. DIABETES: "Do you have a history of diabetes or poor circulation in the feet?"      10. OTHER SYMPTOMS: "Do you have any other symptoms?"        No  Protocols used: Toe Injury-A-AH

## 2021-10-06 NOTE — Progress Notes (Unsigned)
I,[name],acting as a Education administrator for [employer], PA-C.,have documented all relevant documentation on the behalf of Michael Speak, PA-C,as directed by  [employer], PA-C while in the presence of [employer], PA-C.   Established patient visit   Patient: Michael Reyes   DOB: 07-29-1955   66 y.o. Male  MRN: [id number] Visit Date: 10/06/2021  Today's healthcare provider: [name], PA-C   Chief Complaint  Patient presents with   Toe Injury  CC: toe injury  Subjective    Michael "Nim Benson [place] is a 66 yr old male presenting for Left 2nd toe injury.  Slipped on rock yesterday while fishing and has bruising/pain. Taking Ibuprofen and icing.   Medications: Outpatient Medications Prior to Visit  Medication Sig   allopurinol (ZYLOPRIM) 100 MG tablet TAKE 1 TABLET(100 MG) BY MOUTH TWICE DAILY   aspirin 81 MG tablet Take 81 mg by mouth daily.    GLUCOSAMINE-CHONDROIT-VIT C-MN PO Take 1 tablet by mouth daily.    guaiFENesin-codeine 100-10 MG/5ML syrup Take 5 mLs by mouth 3 (three) times daily as needed for cough.   levothyroxine (SYNTHROID) 50 MCG tablet TAKE 1 TABLET(50 MCG) BY MOUTH DAILY   MULTIPLE VITAMIN PO Take 1 tablet by mouth daily.    OMEPRAZOLE PO Take by mouth.   simvastatin (ZOCOR) 40 MG tablet TAKE 1 TABLET BY MOUTH EVERY DAY   azithromycin (ZITHROMAX) 250 MG tablet Take '500mg'$  PO daily x1d and then '250mg'$  daily x4 days   No facility-administered medications prior to visit.    Review of Systems  Constitutional:  Negative for chills and fever.  Musculoskeletal:  Positive for arthralgias, gait problem and joint swelling.  See HPI  {Labs  Heme  Chem  Endocrine  Serology  Results Review (optional):23779}   Objective    BP 120/76 (BP Location: Right Arm, Patient Position: Sitting, Cuff Size: Large)   Pulse 82   Temp 97.8 F (36.6 C) (Oral)   Ht '6\' 3"'$  (1.905 m)   Wt 288 lb 14.4 oz (131 kg)   SpO2 100%   BMI 36.11 kg/m  {Show previous vital  signs (optional):23777}  Physical Exam Vitals reviewed.  Constitutional:      Appearance: Normal appearance. He is obese.  HENT:     Head: Normocephalic and atraumatic.  Cardiovascular:     Rate and Rhythm: Normal rate and regular rhythm.     Pulses: Normal pulses.     Heart sounds: Normal heart sounds.  Pulmonary:     Effort: Pulmonary effort is normal.     Breath sounds: Normal breath sounds.  Musculoskeletal:        General: Swelling, tenderness and signs of injury present. Normal range of motion.     Right lower leg: Edema (chronic due to venous insufficiency) present.     Left lower leg: Edema present.  Skin:    Findings: Erythema present.  Neurological:     Mental Status: He is alert.     No results found for any visits on 10/06/21.  Assessment & Plan     1. Toe injury, unspecified laterality, initial encounter Pt was advised to proceed with ice compresses, keeping his foot raised, rest, take antiinflammatory and acetaminophen intermittently. - DG Foot Complete Left; Future - Post-op shoe   2. Chronic cough, Postinfectious - DG Chest - Might start on ipratropium nasal spray if pt agrees - Symptomatic management, pt was reassured  FU as needed     The patient was advised to  call back or seek an in-person evaluation if the symptoms worsen or if the condition fails to improve as anticipated.  I discussed the assessment and treatment plan with the patient. The patient was provided an opportunity to ask questions and all were answered. The patient agreed with the plan and demonstrated an understanding of the instructions.  The entirety of the information documented in the History of Present Illness, Review of Systems and Physical Exam were personally obtained by me. Portions of this information were initially documented by the CMA and reviewed by me for thoroughness and accuracy.  Portions of this note were created using dictation software and may contain  typographical errors.     Michael Speak, PA-C  [facility] [phone] (phone) [phone] (fax)  [place]

## 2021-10-07 NOTE — Progress Notes (Incomplete)
I,Michael Reyes,acting as a Education administrator for [employer], PA-C.,have documented all relevant documentation on the behalf of Michael Speak, PA-C,as directed by  [employer], PA-C while in the presence of [employer], PA-C.   Established patient visit   Patient: Michael Reyes   DOB: 1955-10-22   66 y.o. Male  MRN: [id number] Visit Date: 10/06/2021  Today's healthcare provider: [name], PA-C   Chief Complaint  Patient presents with  . Toe Injury   Subjective    Michael Reyes "Michael Reyes is a 66 yr old male presenting for Left 2nd toe injury.  Slipped on rock yesterday while fishing and has bruising/pain. Taking Ibuprofen and icing.   Medications: Outpatient Medications Prior to Visit  Medication Sig  . allopurinol (ZYLOPRIM) 100 MG tablet TAKE 1 TABLET(100 MG) BY MOUTH TWICE DAILY  . aspirin 81 MG tablet Take 81 mg by mouth daily.   Marland Kitchen GLUCOSAMINE-CHONDROIT-VIT C-MN PO Take 1 tablet by mouth daily.   Marland Kitchen guaiFENesin-codeine 100-10 MG/5ML syrup Take 5 mLs by mouth 3 (three) times daily as needed for cough.  . levothyroxine (SYNTHROID) 50 MCG tablet TAKE 1 TABLET(50 MCG) BY MOUTH DAILY  . MULTIPLE VITAMIN PO Take 1 tablet by mouth daily.   Marland Kitchen OMEPRAZOLE PO Take by mouth.  . simvastatin (ZOCOR) 40 MG tablet TAKE 1 TABLET BY MOUTH EVERY DAY  . azithromycin (ZITHROMAX) 250 MG tablet Take '500mg'$  PO daily x1d and then '250mg'$  daily x4 days   No facility-administered medications prior to visit.    Review of Systems  Constitutional:  Negative for chills and fever.  Musculoskeletal:  Positive for arthralgias, gait problem and joint swelling.  See HPI  {Labs  Heme  Chem  Endocrine  Serology  Results Review (optional):23779}   Objective    BP 120/76 (BP Location: Right Arm, Patient Position: Sitting, Cuff Size: Large)   Pulse 82   Temp 97.8 F (36.6 C) (Oral)   Ht '6\' 3"'$  (1.905 m)   Wt 288 lb 14.4 oz (131 kg)   SpO2 100%   BMI 36.11 kg/m  {Show previous vital signs  (optional):23777}  Physical Exam Vitals reviewed.  Constitutional:      Appearance: Normal appearance. He is obese.  HENT:     Head: Normocephalic and atraumatic.  Cardiovascular:     Rate and Rhythm: Normal rate and regular rhythm.     Pulses: Normal pulses.     Heart sounds: Normal heart sounds.  Pulmonary:     Effort: Pulmonary effort is normal.     Breath sounds: Normal breath sounds.  Musculoskeletal:        General: Swelling, tenderness and signs of injury present. Normal range of motion.     Right lower leg: Edema (chronic due to venous insufficiency) present.     Left lower leg: Edema (foot) present.  Skin:    Findings: Erythema (second toe, dorsal aspect of the foot) present.  Neurological:     Mental Status: He is alert.     No results found for any visits on 10/06/21.  Assessment & Plan     1. Toe injury, unspecified laterality, initial encounter Pt was advised to proceed with ice compresses, keeping his foot raised, rest, take antiinflammatory and acetaminophen intermittently. - DG Foot Complete Left; Future Showed acute shaft and base fracture of the second proximal phalanx. - Post-op shoe Pt was provided an order for post-op shoe . Address for Med supply store was provided as well.  2. Chronic cough  Postinfectious/persistent - DG Chest - Might start on ipratropium nasal spray to attenuate cough  if pt agrees - Symptomatic management, pt was reassured  FU as needed     The patient was advised to call back or seek an in-person evaluation if the symptoms worsen or if the condition fails to improve as anticipated.  I discussed the assessment and treatment plan with the patient. The patient was provided an opportunity to ask questions and all were answered. The patient agreed with the plan and demonstrated an understanding of the instructions.  The entirety of the information documented in the History of Present Illness, Review of Systems and Physical Exam  were personally obtained by me. Portions of this information were initially documented by the CMA and reviewed by me for thoroughness and accuracy.  Portions of this note were created using dictation software and may contain typographical errors.     Michael Speak, PA-C  [facility] [phone] (phone) [phone] (fax)  [place]

## 2021-10-10 DIAGNOSIS — G4733 Obstructive sleep apnea (adult) (pediatric): Secondary | ICD-10-CM | POA: Diagnosis not present

## 2021-11-02 DIAGNOSIS — G4733 Obstructive sleep apnea (adult) (pediatric): Secondary | ICD-10-CM | POA: Diagnosis not present

## 2021-11-09 DIAGNOSIS — G4733 Obstructive sleep apnea (adult) (pediatric): Secondary | ICD-10-CM | POA: Diagnosis not present

## 2021-11-10 ENCOUNTER — Telehealth: Payer: Self-pay | Admitting: Family Medicine

## 2021-11-10 DIAGNOSIS — E7800 Pure hypercholesterolemia, unspecified: Secondary | ICD-10-CM

## 2021-11-10 MED ORDER — SIMVASTATIN 40 MG PO TABS: 40.0000 mg | ORAL_TABLET | Freq: Every day | ORAL | 3 refills | Status: DC

## 2021-11-10 NOTE — Telephone Encounter (Signed)
[facility] faxed refill request for the following medications:  simvastatin (ZOCOR) 40 MG tablet   Please advise.

## 2021-12-10 DIAGNOSIS — G4733 Obstructive sleep apnea (adult) (pediatric): Secondary | ICD-10-CM | POA: Diagnosis not present

## 2022-01-10 DIAGNOSIS — G4733 Obstructive sleep apnea (adult) (pediatric): Secondary | ICD-10-CM | POA: Diagnosis not present

## 2022-02-02 DIAGNOSIS — G4733 Obstructive sleep apnea (adult) (pediatric): Secondary | ICD-10-CM | POA: Diagnosis not present

## 2022-02-09 DIAGNOSIS — G4733 Obstructive sleep apnea (adult) (pediatric): Secondary | ICD-10-CM | POA: Diagnosis not present

## 2022-02-21 ENCOUNTER — Encounter: Payer: Self-pay | Admitting: Family Medicine

## 2022-02-21 DIAGNOSIS — Z23 Encounter for immunization: Secondary | ICD-10-CM | POA: Diagnosis not present

## 2022-03-12 DIAGNOSIS — G4733 Obstructive sleep apnea (adult) (pediatric): Secondary | ICD-10-CM | POA: Diagnosis not present

## 2022-04-11 ENCOUNTER — Encounter: Payer: Self-pay | Admitting: Gastroenterology

## 2022-04-11 ENCOUNTER — Ambulatory Visit
Admission: RE | Admit: 2022-04-11 | Discharge: 2022-04-11 | Disposition: A | Discharge status: Home / Self Care | Payer: BC Managed Care – PPO | Attending: Gastroenterology | Admitting: Gastroenterology

## 2022-04-11 ENCOUNTER — Ambulatory Visit: Payer: BC Managed Care – PPO | Admitting: Anesthesiology

## 2022-04-11 ENCOUNTER — Other Ambulatory Visit: Payer: Self-pay

## 2022-04-11 ENCOUNTER — Encounter
Admission: RE | Disposition: A | Discharge status: Home / Self Care | Payer: Self-pay | Source: Home / Self Care | Attending: Gastroenterology

## 2022-04-11 DIAGNOSIS — I2510 Atherosclerotic heart disease of native coronary artery without angina pectoris: Secondary | ICD-10-CM | POA: Diagnosis not present

## 2022-04-11 DIAGNOSIS — D122 Benign neoplasm of ascending colon: Secondary | ICD-10-CM | POA: Diagnosis not present

## 2022-04-11 DIAGNOSIS — Z1211 Encounter for screening for malignant neoplasm of colon: Secondary | ICD-10-CM | POA: Insufficient documentation

## 2022-04-11 DIAGNOSIS — F419 Anxiety disorder, unspecified: Secondary | ICD-10-CM | POA: Diagnosis not present

## 2022-04-11 DIAGNOSIS — D124 Benign neoplasm of descending colon: Secondary | ICD-10-CM | POA: Insufficient documentation

## 2022-04-11 DIAGNOSIS — Z860100 Personal history of colon polyps, unspecified: Secondary | ICD-10-CM

## 2022-04-11 DIAGNOSIS — K635 Polyp of colon: Secondary | ICD-10-CM | POA: Diagnosis not present

## 2022-04-11 DIAGNOSIS — D126 Benign neoplasm of colon, unspecified: Secondary | ICD-10-CM | POA: Diagnosis not present

## 2022-04-11 DIAGNOSIS — Z79890 Hormone replacement therapy (postmenopausal): Secondary | ICD-10-CM | POA: Insufficient documentation

## 2022-04-11 DIAGNOSIS — E039 Hypothyroidism, unspecified: Secondary | ICD-10-CM | POA: Diagnosis not present

## 2022-04-11 DIAGNOSIS — G4730 Sleep apnea, unspecified: Secondary | ICD-10-CM | POA: Insufficient documentation

## 2022-04-11 DIAGNOSIS — Z79899 Other long term (current) drug therapy: Secondary | ICD-10-CM | POA: Insufficient documentation

## 2022-04-11 DIAGNOSIS — K5790 Diverticulosis of intestine, part unspecified, without perforation or abscess without bleeding: Secondary | ICD-10-CM | POA: Diagnosis not present

## 2022-04-11 HISTORY — DX: Atherosclerotic heart disease of native coronary artery without angina pectoris: I25.10

## 2022-04-11 HISTORY — DX: Prediabetes: R73.03

## 2022-04-11 HISTORY — DX: Unspecified osteoarthritis, unspecified site: M19.90

## 2022-04-11 HISTORY — DX: Sleep apnea, unspecified: G47.30

## 2022-04-11 HISTORY — PX: COLONOSCOPY WITH PROPOFOL: SHX5780

## 2022-04-11 HISTORY — DX: Hypothyroidism, unspecified: E03.9

## 2022-04-11 SURGERY — COLONOSCOPY WITH PROPOFOL
Anesthesia: General

## 2022-04-11 MED ORDER — PROPOFOL 10 MG/ML IV BOLUS
INTRAVENOUS | Status: DC | PRN
  Administered 2022-04-11: 70 mg via INTRAVENOUS
  Administered 2022-04-11: 30 mg via INTRAVENOUS

## 2022-04-11 MED ORDER — SODIUM CHLORIDE 0.9 % IV SOLN: INTRAVENOUS | Status: DC

## 2022-04-11 MED ORDER — PROPOFOL 1000 MG/100ML IV EMUL
INTRAVENOUS | Status: AC
  Filled 2022-04-11: qty 400

## 2022-04-11 MED ORDER — PROPOFOL 500 MG/50ML IV EMUL
INTRAVENOUS | Status: DC | PRN
  Administered 2022-04-11: 160 ug/kg/min via INTRAVENOUS

## 2022-04-11 MED ORDER — LIDOCAINE HCL (CARDIAC) PF 100 MG/5ML IV SOSY
PREFILLED_SYRINGE | INTRAVENOUS | Status: DC | PRN
  Administered 2022-04-11: 100 mg via INTRAVENOUS

## 2022-04-11 NOTE — H&P (Signed)
[name], MD [address] [place], [place], [place], [address] [address], [place], [place], [place], [address] Phone: [phone]  Fax: [phone]  Primary Care Physician:  Virginia Crews, MD   Pre-Procedure History & Physical: HPI:  Michael Reyes is a 65 y.o. male is here for an colonoscopy.   Past Medical History:  Diagnosis Date   Arthritis    Coronary artery disease    Hypothyroidism    Pre-diabetes    Sleep apnea     Past Surgical History:  Procedure Laterality Date   COLONOSCOPY  2012   FINGER SURGERY Left 01/2016   cyst removal    KNEE SURGERY Right 12/2013   VASECTOMY      Prior to Admission medications   Medication Sig Start Date End Date Taking? Authorizing Provider  allopurinol (ZYLOPRIM) 100 MG tablet TAKE 1 TABLET(100 MG) BY MOUTH TWICE DAILY 04/07/21  Yes Bacigalupo, Dionne Bucy, MD  aspirin 81 MG tablet Take 81 mg by mouth daily.  06/17/08  Yes [provider]  GLUCOSAMINE-CHONDROIT-VIT C-MN PO Take 1 tablet by mouth daily.    Yes [provider]  guaiFENesin-codeine 100-10 MG/5ML syrup Take 5 mLs by mouth 3 (three) times daily as needed for cough. 09/19/21  Yes Bacigalupo, Dionne Bucy, MD  levothyroxine (SYNTHROID) 50 MCG tablet TAKE 1 TABLET(50 MCG) BY MOUTH DAILY 08/15/21  Yes Bacigalupo, Dionne Bucy, MD  MULTIPLE VITAMIN PO Take 1 tablet by mouth daily.    Yes [provider]  OMEPRAZOLE PO Take by mouth.   Yes [provider]  simvastatin (ZOCOR) 40 MG tablet Take 1 tablet (40 mg total) by mouth daily. 11/10/21  Yes Gwyneth Sprout, FNP    Allergies as of 08/03/2021   (No Known Allergies)    Family History  Problem Relation Age of Onset   Tongue cancer Mother    Heart disease Father    Diabetes Father    Obesity Sister    Cancer Sister    Diabetes Sister    Healthy Brother     Social History   Socioeconomic History   Marital status: Married    Spouse name: Not on file   Number of  children: 2   Years of education: PHD   Highest education level: Not on file  Occupational History   Occupation: Professor  Tobacco Use   Smoking status: Never   Smokeless tobacco: Never  Vaping Use   Vaping Use: Never used  Substance and Sexual Activity   Alcohol use: Yes    Alcohol/week: 0.0 standard drinks of alcohol    Comment: Occasionally   Drug use: No   Sexual activity: Yes  Other Topics Concern   Not on file  Social History Narrative   Not on file   Social Determinants of Health   Financial Resource Strain: Not on file  Food Insecurity: Not on file  Transportation Needs: Not on file  Physical Activity: Insufficiently Active (03/06/2017)   Exercise Vital Sign    Days of Exercise per Week: 1 day    Minutes of Exercise per Session: 120 min  Stress: Not on file  Social Connections: Not on file  Intimate Partner Violence: Not on file    Review of Systems: See HPI, otherwise negative ROS  Physical Exam: BP 126/79   Pulse 71   Temp (!) 97.1 F (36.2 C) (Temporal)   Resp 18   Ht '6\' 3"'$  (1.905 m)   Wt 129.3 kg   SpO2  94%   BMI 35.62 kg/m  General:   Alert,  pleasant and cooperative in NAD Head:  Normocephalic and atraumatic. Neck:  Supple; no masses or thyromegaly. Lungs:  Clear throughout to auscultation, normal respiratory effort.    Heart:  +S1, +S2, Regular rate and rhythm, No edema. Abdomen:  Soft, nontender and nondistended. Normal bowel sounds, without guarding, and without rebound.   Neurologic:  Alert and  oriented x4;  grossly normal neurologically.  Impression/Plan: Michael Reyes is here for an colonoscopy to be performed for surveillance due to prior history of colon polyps   Risks, benefits, limitations, and alternatives regarding  colonoscopy have been reviewed with the patient.  Questions have been answered.  All parties agreeable.   [name], MD  04/11/2022, 7:51 AM

## 2022-04-11 NOTE — Transfer of Care (Signed)
Immediate Anesthesia Transfer of Care Note  Patient: Michael Reyes  Procedure(s) Performed: COLONOSCOPY WITH PROPOFOL  Patient Location: Endoscopy Unit  Anesthesia Type:General  Level of Consciousness: awake, drowsy, and patient cooperative  Airway & Oxygen Therapy: Patient Spontanous Breathing and Patient connected to face mask oxygen  Post-op Assessment: Report given to RN and Post -op Vital signs reviewed and stable  Post vital signs: Reviewed and stable  Last Vitals:  Vitals Value Taken Time  BP 93/62 04/11/22 0823  Temp 35.7 C 04/11/22 0822  Pulse 59 04/11/22 0829  Resp 15 04/11/22 0829  SpO2 97 % 04/11/22 0829  Vitals shown include unvalidated device data.  Last Pain:  Vitals:   04/11/22 0822  TempSrc: Temporal  PainSc: Asleep         Complications: No notable events documented.

## 2022-04-11 NOTE — Anesthesia Postprocedure Evaluation (Signed)
Anesthesia Post Note  Patient: Michael Reyes  Procedure(s) Performed: COLONOSCOPY WITH PROPOFOL  Patient location during evaluation: Endoscopy Anesthesia Type: General Level of consciousness: awake and alert Pain management: pain level controlled Vital Signs Assessment: post-procedure vital signs reviewed and stable Respiratory status: spontaneous breathing, nonlabored ventilation, respiratory function stable and patient connected to nasal cannula oxygen Cardiovascular status: blood pressure returned to baseline and stable Postop Assessment: no apparent nausea or vomiting Anesthetic complications: no  No notable events documented.   Last Vitals:  Vitals:   04/11/22 0832 04/11/22 0844  BP: 98/71 101/72  Pulse: 70 (!) 59  Resp: 17 13  Temp:    SpO2: 93% 93%    Last Pain:  Vitals:   04/11/22 0844  TempSrc:   PainSc: 0-No pain                 Ilene Qua

## 2022-04-11 NOTE — Anesthesia Procedure Notes (Addendum)
Procedure Name: General with mask airway Date/Time: 04/11/2022 8:00 AM  Performed by: Kelton Pillar, CRNAPre-anesthesia Checklist: Patient identified, Emergency Drugs available, Suction available and Patient being monitored Patient Re-evaluated:Patient Re-evaluated prior to induction Oxygen Delivery Method: Simple face mask Induction Type: IV induction Airway Equipment and Method: Oral airway Placement Confirmation: positive ETCO2, CO2 detector and breath sounds checked- equal and bilateral Dental Injury: Teeth and Oropharynx as per pre-operative assessment

## 2022-04-11 NOTE — Op Note (Addendum)
[facility] Gastroenterology Patient Name: Michael Reyes Procedure Date: 04/11/2022 7:14 AM MRN: [id number] Account #: [id number] Date of Birth: [birth year] Admit Type: Outpatient Age: 66 Room: [facility] ENDO ROOM 2 Gender: Male Note Status: Finalized Instrument Name: Jasper Riling [id number] Procedure:             Colonoscopy Indications:           Surveillance: Personal history of adenomatous polyps                         on last colonoscopy 5 years ago Providers:             [name] MD, MD Referring MD:          Dionne Bucy. Bacigalupo (Referring MD) Medicines:             Monitored Anesthesia Care Complications:         No immediate complications. Procedure:             Pre-Anesthesia Assessment:                        - Prior to the procedure, a History and Physical was                         performed, and patient medications, allergies and                         sensitivities were reviewed. The patient's tolerance                         of previous anesthesia was reviewed.                        - The risks and benefits of the procedure and the                         sedation options and risks were discussed with the                         patient. All questions were answered and informed                         consent was obtained.                        - ASA Grade Assessment: II - A patient with mild                         systemic disease.                        After obtaining informed consent, the colonoscope was                         passed under direct vision. Throughout the procedure,                         the patient's blood pressure, pulse, and oxygen                         saturations  were monitored continuously. The                         Colonoscope was introduced through the anus and                         advanced to the the cecum, identified by the                         appendiceal orifice. The colonoscopy was performed                          with ease. The patient tolerated the procedure well.                         The quality of the bowel preparation was excellent.                         The ileocecal valve, appendiceal orifice, and rectum                         were photographed. Findings:      The perianal and digital rectal examinations were normal.      Three sessile polyps were found in the descending colon, ascending colon       and cecum. The polyps were 5 to 7 mm in size. These polyps were removed       with a cold snare. Resection and retrieval were complete.      The exam was otherwise without abnormality on direct and retroflexion       views. Impression:            - Three 5 to 7 mm polyps in the descending colon, in                         the ascending colon and in the cecum, removed with a                         cold snare. Resected and retrieved.                        - The examination was otherwise normal on direct and                         retroflexion views. Recommendation:        - Discharge patient to home (with escort).                        - Resume previous diet.                        - Continue present medications.                        - Await pathology results.                        - Repeat colonoscopy for surveillance based on  pathology results. Procedure Code(s):     --- Professional ---                        [phone], Colonoscopy, flexible; with removal of                         tumor(s), polyp(s), or other lesion(s) by snare                         technique Diagnosis Code(s):     --- Professional ---                        Z86.010, Personal history of colonic polyps                        D12.4, Benign neoplasm of descending colon                        D12.2, Benign neoplasm of ascending colon                        D12.0, Benign neoplasm of cecum CPT copyright 2022 American Medical Association. All rights reserved. The codes documented  in this report are preliminary and upon coder review may  be revised to meet current compliance requirements. [name], MD [name] MD, MD 04/11/2022 8:22:28 AM This report has been signed electronically. Number of Addenda: 0 Note Initiated On: 04/11/2022 7:14 AM Scope Withdrawal Time: 0 hours 13 minutes 45 seconds  Total Procedure Duration: 0 hours 17 minutes 44 seconds  Estimated Blood Loss:  Estimated blood loss: none.      [facility]

## 2022-04-11 NOTE — Anesthesia Preprocedure Evaluation (Signed)
Anesthesia Evaluation  Patient identified by MRN, date of birth, ID band Patient awake    Reviewed: Allergy & Precautions, NPO status , Patient's Chart, lab work & pertinent test results  History of Anesthesia Complications Negative for: history of anesthetic complications  Airway Mallampati: III  TM Distance: >3 FB Neck ROM: full    Dental  (+) Chipped   Pulmonary sleep apnea and Continuous Positive Airway Pressure Ventilation    Pulmonary exam normal        Cardiovascular Exercise Tolerance: Good + CAD  Normal cardiovascular exam     Neuro/Psych  PSYCHIATRIC DISORDERS Anxiety     negative neurological ROS     GI/Hepatic negative GI ROS, Neg liver ROS,,,  Endo/Other  Hypothyroidism    Renal/GU negative Renal ROS  negative genitourinary   Musculoskeletal   Abdominal   Peds  Hematology negative hematology ROS (+)   Anesthesia Other Findings Past Medical History: No date: Arthritis No date: Coronary artery disease No date: Hypothyroidism No date: Pre-diabetes No date: Sleep apnea  Past Surgical History: 2012: COLONOSCOPY 01/2016: FINGER SURGERY; Left     Comment:  cyst removal  12/2013: KNEE SURGERY; Right No date: VASECTOMY  BMI    Body Mass Index: 35.62 kg/m      Reproductive/Obstetrics negative OB ROS                             Anesthesia Physical Anesthesia Plan  ASA: 2  Anesthesia Plan: General   Post-op Pain Management: Minimal or no pain anticipated   Induction: Intravenous  PONV Risk Score and Plan: Propofol infusion and TIVA  Airway Management Planned: Natural Airway and Nasal Cannula  Additional Equipment:   Intra-op Plan:   Post-operative Plan:   Informed Consent: I have reviewed the patients History and Physical, chart, labs and discussed the procedure including the risks, benefits and alternatives for the proposed anesthesia with the patient or  authorized representative who has indicated his/her understanding and acceptance.     Dental Advisory Given  Plan Discussed with: Anesthesiologist, CRNA and Surgeon  Anesthesia Plan Comments: (Patient consented for risks of anesthesia including but not limited to:  - adverse reactions to medications - risk of airway placement if required - damage to eyes, teeth, lips or other oral mucosa - nerve damage due to positioning  - sore throat or hoarseness - Damage to heart, brain, nerves, lungs, other parts of body or loss of life  Patient voiced understanding.)       Anesthesia Quick Evaluation

## 2022-04-12 ENCOUNTER — Encounter: Payer: Self-pay | Admitting: Gastroenterology

## 2022-04-12 LAB — SURGICAL PATHOLOGY

## 2022-04-17 ENCOUNTER — Encounter: Payer: Self-pay | Admitting: Gastroenterology

## 2022-05-05 DIAGNOSIS — G4733 Obstructive sleep apnea (adult) (pediatric): Secondary | ICD-10-CM | POA: Diagnosis not present

## 2022-05-12 ENCOUNTER — Other Ambulatory Visit: Payer: Self-pay

## 2022-05-12 ENCOUNTER — Telehealth: Payer: Self-pay | Admitting: Family Medicine

## 2022-05-12 ENCOUNTER — Other Ambulatory Visit: Payer: Self-pay | Admitting: Family Medicine

## 2022-05-12 MED ORDER — ALLOPURINOL 100 MG PO TABS: ORAL_TABLET | ORAL | 0 refills | Status: DC

## 2022-05-12 NOTE — Telephone Encounter (Signed)
[facility] faxed refill request for the following medications:   allopurinol (ZYLOPRIM) 100 MG tablet    Please advise

## 2022-05-12 NOTE — Telephone Encounter (Signed)
Refill sent. Pt needs an appt for further refills.  KP

## 2022-06-16 ENCOUNTER — Ambulatory Visit: Payer: Self-pay | Admitting: *Deleted

## 2022-06-16 DIAGNOSIS — M79642 Pain in left hand: Secondary | ICD-10-CM

## 2022-06-16 NOTE — Telephone Encounter (Signed)
  Chief Complaint: possible cyst at joint of finger Symptoms: left index finger- cyst at joint- mild pain Frequency: 3 weeks Pertinent Negatives: Patient denies fever, redness Disposition: []$ ED /[]$ Urgent Care (no appt availability in office) / []$ Appointment(In office/virtual)/ []$  [facility]/ []$ Home Care/ []$ Refused Recommended Disposition /[]$  Mobile Bus/ [x]$  Follow-up with PCP Additional Notes: Patient wants to know if he can have Ortho referral for this- who would be best hand specialist   Reason for Disposition  [1] Swollen joint AND [2] no fever or redness  Answer Assessment - Initial Assessment Questions 1. ONSET: "When did the pain start?"      3 weeks 2. LOCATION and RADIATION: "Where is the pain located?"  (e.g., fingertip, around nail, joint, entire  finger)      Left hand- top of index finger 3. SEVERITY: "How bad is the pain?" "What does it keep you from doing?"   (Scale 1-10; or mild, moderate, severe)  - MILD (1-3): doesn't interfere with normal activities.   - MODERATE (4-7): interferes with normal activities or awakens from sleep.  - SEVERE (8-10): excruciating pain, unable to hold a glass of water or bend finger even a little.     Mild with usage 4. APPEARANCE: "What does the finger look like?" (e.g., redness, swelling, bruising, pallor)     Cystic looking  5. WORK OR EXERCISE: "Has there been any recent work or exercise that involved this part (i.e., fingers or hand) of the body?"     Recent injury to finger- then cyst appeared 6. CAUSE: "What do you think is causing the pain?"     Possible cyst 7. AGGRAVATING FACTORS: "What makes the pain worse?" (e.g., using computer)     Using finger causes discomfort- hitting with activity causes discomfort 8. OTHER SYMPTOMS: "Do you have any other symptoms?" (e.g., fever, neck pain, numbness)     none  Protocols used: Finger Pain-A-AH

## 2022-06-16 NOTE — Telephone Encounter (Signed)
Summary: Swelling in finger   Pt has what he calls a cyst/bump under the skin under the joint of his left index finger. Going on 3 weeks, more and more painful.         Attempted to call patient- left message to call office

## 2022-06-19 NOTE — Telephone Encounter (Signed)
Patient advised.

## 2022-06-19 NOTE — Telephone Encounter (Signed)
Dr Peggye Ley is only hand Ortho in town.  Ok to place referral to ORtho at patients request to [facility] - use hand pain as diagnosis code

## 2022-06-19 NOTE — Addendum Note (Signed)
Addended by: Shawna Orleans on: 06/19/2022 11:14 AM   Modules accepted: Orders

## 2022-06-22 DIAGNOSIS — M79644 Pain in right finger(s): Secondary | ICD-10-CM | POA: Diagnosis not present

## 2022-06-22 DIAGNOSIS — M79645 Pain in left finger(s): Secondary | ICD-10-CM | POA: Diagnosis not present

## 2022-07-23 ENCOUNTER — Ambulatory Visit (INDEPENDENT_AMBULATORY_CARE_PROVIDER_SITE_OTHER): Payer: BC Managed Care – PPO

## 2022-07-23 ENCOUNTER — Ambulatory Visit
Admission: EM | Admit: 2022-07-23 | Discharge: 2022-07-23 | Disposition: A | Discharge status: Home / Self Care | Payer: BC Managed Care – PPO | Attending: Emergency Medicine | Admitting: Emergency Medicine

## 2022-07-23 DIAGNOSIS — R051 Acute cough: Secondary | ICD-10-CM

## 2022-07-23 DIAGNOSIS — J209 Acute bronchitis, unspecified: Secondary | ICD-10-CM

## 2022-07-23 DIAGNOSIS — R0602 Shortness of breath: Secondary | ICD-10-CM

## 2022-07-23 DIAGNOSIS — R059 Cough, unspecified: Secondary | ICD-10-CM | POA: Diagnosis not present

## 2022-07-23 MED ORDER — ALBUTEROL SULFATE (2.5 MG/3ML) 0.083% IN NEBU
2.5000 mg | INHALATION_SOLUTION | RESPIRATORY_TRACT | Status: AC
  Administered 2022-07-23: 2.5 mg via RESPIRATORY_TRACT

## 2022-07-23 MED ORDER — AZITHROMYCIN 250 MG PO TABS: 250.0000 mg | ORAL_TABLET | Freq: Every day | ORAL | 0 refills | Status: DC

## 2022-07-23 MED ORDER — AEROCHAMBER PLUS FLO-VU MEDIUM MISC
1.0000 | Freq: Once | Status: AC
  Administered 2022-07-23: 1

## 2022-07-23 MED ORDER — PREDNISONE 10 MG PO TABS: 40.0000 mg | ORAL_TABLET | Freq: Every day | ORAL | 0 refills | Status: AC

## 2022-07-23 MED ORDER — ALBUTEROL SULFATE HFA 108 (90 BASE) MCG/ACT IN AERS
2.0000 | INHALATION_SPRAY | Freq: Once | RESPIRATORY_TRACT | Status: AC
  Administered 2022-07-23: 2 via RESPIRATORY_TRACT

## 2022-07-23 NOTE — ED Triage Notes (Signed)
Patient to Urgent Care with complaints of productive cough x9 days. Reports symptoms started after mowing his lawn.  Using mucinex/ allergy medications.

## 2022-07-23 NOTE — ED Provider Notes (Signed)
Michael Reyes    CSN: [id number] Arrival date & time: 07/23/22  [id number]      History   Chief Complaint Chief Complaint  Patient presents with   Cough    HPI Michael Reyes is a 67 y.o. male.  Accompanied by his wife, patient presents with 9-day history of sinus congestion and productive cough.  He feels mildly short of breath, worse with exertion.  His symptoms started after he was mowing his lawn.  He has been treating with allergy medications and Mucinex.  Patient denies fever, chills, rash, ear pain, sore throat, chest pain, weakness, or other symptoms.  His medical history includes allergies, OSA on CPAP, mitral valve disorder, hypothyroidism, arthritis, prediabetes.    The history is provided by the patient, the spouse and medical records.    Past Medical History:  Diagnosis Date   Arthritis    Coronary artery disease    Hypothyroidism    Pre-diabetes    Sleep apnea     Patient Active Problem List   Diagnosis Date Noted   Adenomatous polyp of colon 04/11/2022   History of prediabetes 01/13/2021   Dribbling following urination 12/08/2016   Ganglion cyst of finger of left hand 12/08/2016   History of colonic polyps 02/24/2016   Obesity 02/24/2016   Allergic rhinitis 10/01/2015   Cough 10/01/2015   Venous insufficiency of leg 07/27/2015   Anxiety 04/02/2015   Coronary artery disease involving native coronary artery of native heart without angina pectoris 04/02/2015   Dermatitis, eczematoid 04/02/2015   Hypercholesteremia 04/02/2015   Disorder of mitral valve 04/02/2015   Hypothyroidism 04/02/2015   Heart valve disease 04/02/2015   OSA (obstructive sleep apnea) 03/30/2014   Arthritis due to gout 05/08/2008   ED (erectile dysfunction) of organic origin 05/05/2008    Past Surgical History:  Procedure Laterality Date   COLONOSCOPY  2012   COLONOSCOPY WITH PROPOFOL N/A 04/11/2022   Procedure: COLONOSCOPY WITH PROPOFOL;  Surgeon: [name], MD;   Location: [facility] ENDOSCOPY;  Service: Gastroenterology;  Laterality: N/A;   FINGER SURGERY Left 01/2016   cyst removal    KNEE SURGERY Right 12/2013   VASECTOMY         Home Medications    Prior to Admission medications   Medication Sig Start Date End Date Taking? Authorizing Provider  azithromycin (ZITHROMAX) 250 MG tablet Take 1 tablet (250 mg total) by mouth daily. Take first 2 tablets together, then 1 every day until finished. 07/23/22  Yes Sharion Balloon, NP  predniSONE (DELTASONE) 10 MG tablet Take 4 tablets (40 mg total) by mouth daily for 5 days. 07/23/22 07/28/22 Yes Sharion Balloon, NP  allopurinol (ZYLOPRIM) 100 MG tablet TAKE 1 TABLET(100 MG) BY MOUTH TWICE DAILY 05/12/22   Virginia Crews, MD  aspirin 81 MG tablet Take 81 mg by mouth daily.  06/17/08   [provider]  GLUCOSAMINE-CHONDROIT-VIT C-MN PO Take 1 tablet by mouth daily.     [provider]  guaiFENesin-codeine 100-10 MG/5ML syrup Take 5 mLs by mouth 3 (three) times daily as needed for cough. 09/19/21   Virginia Crews, MD  levothyroxine (SYNTHROID) 50 MCG tablet TAKE 1 TABLET(50 MCG) BY MOUTH DAILY 05/15/22   Virginia Crews, MD  MULTIPLE VITAMIN PO Take 1 tablet by mouth daily.     [provider]  OMEPRAZOLE PO Take by mouth.    [provider]  simvastatin (ZOCOR) 40 MG tablet Take 1 tablet (40 mg total) by  mouth daily. 11/10/21   Gwyneth Sprout, FNP    Family History Family History  Problem Relation Age of Onset   Tongue cancer Mother    Heart disease Father    Diabetes Father    Obesity Sister    Cancer Sister    Diabetes Sister    Healthy Brother     Social History Social History   Tobacco Use   Smoking status: Never   Smokeless tobacco: Never  Vaping Use   Vaping Use: Never used  Substance Use Topics   Alcohol use: Yes    Alcohol/week: 0.0 standard drinks of alcohol    Comment: Occasionally   Drug use: No     Allergies   Patient has no known  allergies.   Review of Systems Review of Systems  Constitutional:  Negative for chills and fever.  HENT:  Positive for congestion. Negative for ear pain and sore throat.   Respiratory:  Positive for cough and shortness of breath.   Cardiovascular:  Negative for chest pain and palpitations.  Gastrointestinal:  Negative for nausea and vomiting.  Skin:  Negative for color change and rash.  All other systems reviewed and are negative.    Physical Exam Triage Vital Signs ED Triage Vitals [07/23/22 0947]  Enc Vitals Group     BP      Pulse Rate 80     Resp 20     Temp 98.1 F (36.7 C)     Temp src      SpO2 (!) 89 %     Weight      Height      Head Circumference      Peak Flow      Pain Score      Pain Loc      Pain Edu?      Excl. in [place]?    No data found.  Updated Vital Signs BP 101/65   Pulse 80   Temp 98.1 F (36.7 C)   Resp 20   Ht 6\' 3"  (1.905 m)   SpO2 96%   BMI 35.62 kg/m   Visual Acuity Right Eye Distance:   Left Eye Distance:   Bilateral Distance:    Right Eye Near:   Left Eye Near:    Bilateral Near:     Physical Exam Vitals and nursing note reviewed.  Constitutional:      General: He is not in acute distress.    Appearance: Normal appearance. He is well-developed. He is not ill-appearing.  HENT:     Right Ear: Tympanic membrane normal.     Left Ear: Tympanic membrane normal.     Nose: Nose normal.     Mouth/Throat:     Mouth: Mucous membranes are moist.     Pharynx: Oropharynx is clear.  Cardiovascular:     Rate and Rhythm: Normal rate and regular rhythm.     Heart sounds: Normal heart sounds.  Pulmonary:     Effort: Pulmonary effort is normal. No respiratory distress.     Breath sounds: Wheezing present.  Musculoskeletal:     Cervical back: Neck supple.  Skin:    General: Skin is warm and dry.  Neurological:     Mental Status: He is alert.  Psychiatric:        Mood and Affect: Mood normal.        Behavior: Behavior normal.       UC Treatments / Results  Labs (all labs ordered are listed, but  only abnormal results are displayed) Labs Reviewed - No data to display  EKG   Radiology DG Chest 2 View  Result Date: 07/23/2022 CLINICAL DATA:  Productive cough. EXAM: CHEST - 2 VIEW COMPARISON:  October 06, 2021. FINDINGS: The heart size and mediastinal contours are within normal limits. Both lungs are clear. The visualized skeletal structures are unremarkable. IMPRESSION: No active cardiopulmonary disease. Electronically Signed   By: Marijo Conception M.D.   On: 07/23/2022 10:13    Procedures Procedures (including critical care time)  Medications Ordered in UC Medications  albuterol (PROVENTIL) (2.5 MG/3ML) 0.083% nebulizer solution 2.5 mg (2.5 mg Nebulization Given 07/23/22 0955)  albuterol (VENTOLIN HFA) 108 (90 Base) MCG/ACT inhaler 2 puff (2 puffs Inhalation Provided for home use 07/23/22 1028)  AeroChamber Plus Flo-Vu Medium MISC 1 each (1 each Other Given 07/23/22 1028)    Initial Impression / Assessment and Plan / UC Course  I have reviewed the triage vital signs and the nursing notes.  Pertinent labs & imaging results that were available during my care of the patient were reviewed by me and considered in my medical decision making (see chart for details).    Cough, acute bronchitis, shortness of breath.  O2 sat 89-90% on arrival.  Improved to 91-94% on room air after nebulizer treatment.  CXR negative.  Patient and his wife decline transfer to the ED.  Treating with albuterol inhaler with spacer, prednisone, Zithromax.  Discussed monitoring O2 level at home; they have a sat monitor already.  Instructed patient to see his PCP tomorrow.  ED precautions discussed at length.  Patient and his wife agree to plan of care.   Final Clinical Impressions(s) / UC Diagnoses   Final diagnoses:  Acute cough  Acute bronchitis, unspecified organism  Shortness of breath     Discharge Instructions      Use the  albuterol inhaler with spacer as directed.  Take the prednisone and Zithromax as directed.    Follow up with your primary care provider tomorrow.    Monitor your oxygen level at home as discussed. Go to the emergency department if you have worsening symptoms.        ED Prescriptions     Medication Sig Dispense Auth. Provider   predniSONE (DELTASONE) 10 MG tablet Take 4 tablets (40 mg total) by mouth daily for 5 days. 20 tablet Sharion Balloon, NP   azithromycin (ZITHROMAX) 250 MG tablet Take 1 tablet (250 mg total) by mouth daily. Take first 2 tablets together, then 1 every day until finished. 6 tablet Sharion Balloon, NP      PDMP not reviewed this encounter.   Sharion Balloon, NP 07/23/22 1034

## 2022-07-23 NOTE — Discharge Instructions (Addendum)
Use the albuterol inhaler with spacer as directed.  Take the prednisone and Zithromax as directed.    Follow up with your primary care provider tomorrow.    Monitor your oxygen level at home as discussed. Go to the emergency department if you have worsening symptoms.

## 2022-07-24 ENCOUNTER — Ambulatory Visit: Payer: BC Managed Care – PPO | Admitting: Physician Assistant

## 2022-07-24 ENCOUNTER — Telehealth: Payer: Self-pay

## 2022-07-24 ENCOUNTER — Other Ambulatory Visit: Payer: Self-pay | Admitting: Physician Assistant

## 2022-07-24 VITALS — BP 131/68 | HR 82 | Temp 97.8°F | Ht 74.0 in | Wt 294.8 lb

## 2022-07-24 DIAGNOSIS — M1A9XX0 Chronic gout, unspecified, without tophus (tophi): Secondary | ICD-10-CM

## 2022-07-24 DIAGNOSIS — R0600 Dyspnea, unspecified: Secondary | ICD-10-CM

## 2022-07-24 DIAGNOSIS — G4733 Obstructive sleep apnea (adult) (pediatric): Secondary | ICD-10-CM

## 2022-07-24 DIAGNOSIS — E7800 Pure hypercholesterolemia, unspecified: Secondary | ICD-10-CM

## 2022-07-24 DIAGNOSIS — R739 Hyperglycemia, unspecified: Secondary | ICD-10-CM

## 2022-07-24 DIAGNOSIS — E039 Hypothyroidism, unspecified: Secondary | ICD-10-CM

## 2022-07-24 DIAGNOSIS — Z125 Encounter for screening for malignant neoplasm of prostate: Secondary | ICD-10-CM

## 2022-07-24 DIAGNOSIS — J209 Acute bronchitis, unspecified: Secondary | ICD-10-CM | POA: Diagnosis not present

## 2022-07-24 MED ORDER — HYDROCOD POLI-CHLORPHE POLI ER 10-8 MG/5ML PO SUER: 5.0000 mL | Freq: Every evening | ORAL | 0 refills | Status: DC | PRN

## 2022-07-24 NOTE — Telephone Encounter (Signed)
Copied from [place] [id number]. Topic: General - Inquiry >> Jul 24, 2022 11:00 AM Rosanne Ashing P wrote: Reason for CRM: pt was just in earlier and forgot to ask about a cough medication with codeine for sleep at night.  [facility]

## 2022-07-24 NOTE — Progress Notes (Signed)
I,Sha'taria Tyson,acting as a Education administrator for [employer], PA-C.,have documented all relevant documentation on the behalf of Mikey Kirschner, PA-C,as directed by  Mikey Kirschner, PA-C while in the presence of Mikey Kirschner, PA-C.   Established patient visit   Patient: Michael Reyes   DOB: April 23, 1956   67 y.o. Male  MRN: [id number] Visit Date: 07/24/2022  Today's healthcare provider: Mikey Kirschner, PA-C   Cc. F/u urgent care  Subjective    HPI  Pt was seen at an urgent care yesterday for SOB and cough, O2 sat 89-90% on arrival and improved to 91-94% on room air after nebulizer treatment. Chest xray performed was w/o acute changes.  He was prescribed prednisone , an albuterol inhaler, and azithromycin. Reports feeling better today, cough is now productive. At home his O2 has been 90-91%. Reports concern over PE, his sleep apnea worsening and not being helped by CPAP, and cardiovascular disease.  Pt has a history of CAD and used to see cardiology, last visit in 2019. He reports over the past few months worsening SOB with slight exertion-- ie walking from car to office.   Denies chest pain, lightheadedness.  Medications: Outpatient Medications Prior to Visit  Medication Sig   allopurinol (ZYLOPRIM) 100 MG tablet TAKE 1 TABLET(100 MG) BY MOUTH TWICE DAILY   aspirin 81 MG tablet Take 81 mg by mouth daily.    azithromycin (ZITHROMAX) 250 MG tablet Take 1 tablet (250 mg total) by mouth daily. Take first 2 tablets together, then 1 every day until finished.   GLUCOSAMINE-CHONDROIT-VIT C-MN PO Take 1 tablet by mouth daily.    guaiFENesin-codeine 100-10 MG/5ML syrup Take 5 mLs by mouth 3 (three) times daily as needed for cough.   levothyroxine (SYNTHROID) 50 MCG tablet TAKE 1 TABLET(50 MCG) BY MOUTH DAILY   MULTIPLE VITAMIN PO Take 1 tablet by mouth daily.    OMEPRAZOLE PO Take by mouth.   predniSONE (DELTASONE) 10 MG tablet Take 4 tablets (40 mg total) by mouth daily for 5 days.    simvastatin (ZOCOR) 40 MG tablet Take 1 tablet (40 mg total) by mouth daily.   No facility-administered medications prior to visit.    Review of Systems  Constitutional:  Negative for fatigue and fever.  Respiratory:  Positive for cough. Negative for shortness of breath.   Cardiovascular:  Negative for chest pain, palpitations and leg swelling.  Neurological:  Negative for dizziness and headaches.     Objective    BP 131/68 (BP Location: Left Arm, Patient Position: Sitting, Cuff Size: Large)   Pulse 82   Temp 97.8 F (36.6 C) (Oral)   Ht 6\' 2"  (1.88 m)   Wt 294 lb 12.8 oz (133.7 kg)   SpO2 96% Comment: lowest seen  BMI 37.85 kg/m    Physical Exam Constitutional:      General: He is awake.     Appearance: He is well-developed.  HENT:     Head: Normocephalic.  Eyes:     Conjunctiva/sclera: Conjunctivae normal.  Cardiovascular:     Rate and Rhythm: Normal rate and regular rhythm.     Heart sounds: Normal heart sounds.  Pulmonary:     Effort: Pulmonary effort is normal.     Breath sounds: Normal breath sounds. No wheezing, rhonchi or rales.  Skin:    General: Skin is warm.  Neurological:     Mental Status: He is alert and oriented to person, place, and time.  Psychiatric:  Attention and Perception: Attention normal.        Mood and Affect: Mood normal.        Speech: Speech normal.        Behavior: Behavior is cooperative.     No results found for any visits on 07/24/22.  Assessment & Plan     Bronchitis Cont. Prescribed therapy. Lowest O2 seen in office 96%. Pt appears clinically well to me, no respiratory distress. O2 did not drop below 96% even during coughing fits. Pt due for routine labs, added, but will check cbc, cmp, ddimer (but low sus of PE given O2 improvement, clinically appears well).   2. Dyspnea on exertion Ref back to cardiology given known CAD Ref to pulm for osa/cpap investigation and ongoing dyspnea.  Return if symptoms worsen or fail  to improve.      I, Mikey Kirschner, PA-C have reviewed all documentation for this visit. The documentation on  07/24/22  for the exam, diagnosis, procedures, and orders are all accurate and complete.  Mikey Kirschner, PA-C [facility] [address] [place], [place], [address] Office: [phone] Fax: [place]

## 2022-07-25 ENCOUNTER — Encounter: Payer: Self-pay | Admitting: Physician Assistant

## 2022-07-25 ENCOUNTER — Ambulatory Visit: Payer: Self-pay

## 2022-07-25 DIAGNOSIS — R0602 Shortness of breath: Secondary | ICD-10-CM | POA: Diagnosis not present

## 2022-07-25 DIAGNOSIS — R079 Chest pain, unspecified: Secondary | ICD-10-CM | POA: Diagnosis not present

## 2022-07-25 DIAGNOSIS — G4733 Obstructive sleep apnea (adult) (pediatric): Secondary | ICD-10-CM | POA: Diagnosis not present

## 2022-07-25 DIAGNOSIS — R0609 Other forms of dyspnea: Secondary | ICD-10-CM | POA: Diagnosis not present

## 2022-07-25 DIAGNOSIS — I2510 Atherosclerotic heart disease of native coronary artery without angina pectoris: Secondary | ICD-10-CM | POA: Diagnosis not present

## 2022-07-25 DIAGNOSIS — I10 Essential (primary) hypertension: Secondary | ICD-10-CM | POA: Diagnosis not present

## 2022-07-25 DIAGNOSIS — R42 Dizziness and giddiness: Secondary | ICD-10-CM | POA: Diagnosis not present

## 2022-07-25 DIAGNOSIS — R0789 Other chest pain: Secondary | ICD-10-CM | POA: Diagnosis not present

## 2022-07-25 DIAGNOSIS — E785 Hyperlipidemia, unspecified: Secondary | ICD-10-CM | POA: Diagnosis not present

## 2022-07-25 DIAGNOSIS — R051 Acute cough: Secondary | ICD-10-CM | POA: Diagnosis not present

## 2022-07-25 DIAGNOSIS — R059 Cough, unspecified: Secondary | ICD-10-CM | POA: Diagnosis not present

## 2022-07-25 NOTE — Telephone Encounter (Signed)
Patient advised. Verbalized understanding

## 2022-07-25 NOTE — Telephone Encounter (Signed)
Pt was called an recommended to go to ED

## 2022-07-25 NOTE — Telephone Encounter (Signed)
  Chief Complaint: [place] phlegm -  Symptoms: Cough - Low O2 levels 91% Frequency: today Pertinent Negatives: Patient denies fever Disposition: [] ED /[] Urgent Care (no appt availability in office) / [] Appointment(In office/virtual)/ []  [place] [facility]/ [] Home Care/ [] Refused Recommended Disposition /[] [facility]/ [x]  Follow-up with PCP Additional Notes: PT called regarding brown phlegm. Wife thinks this is old blood. Possible low O2 level 91% per pulse OX. Purchased by pt. Pt was seen yesterday and started on medication. Today pt has brown phlegm. No appts available for today. Called office - closed for lunch.  Please advise.      Summary: coughing up brown phelgm   Patient called in states still has cough, coughing up brown phlegm and asking what to do about. He already saw Dr B yesterday       Reason for Disposition  Coughing up rusty-colored (reddish-brown) sputum  Answer Assessment - Initial Assessment Questions 1. ONSET: "When did the cough begin?"      Pt seen yesterday for same 2. SEVERITY: "How bad is the cough today?"      Moderate 3. SPUTUM: "Describe the color of your sputum" (none, dry cough; clear, white, yellow, green)     Brown 4. HEMOPTYSIS: "Are you coughing up any blood?" If so ask: "How much?" (flecks, streaks, tablespoons, etc.)     Brown 5. DIFFICULTY BREATHING: "Are you having difficulty breathing?" If Yes, ask: "How bad is it?" (e.g., mild, moderate, severe)    - MILD: No SOB at rest, mild SOB with walking, speaks normally in sentences, can lie down, no retractions, pulse < 100.    - MODERATE: SOB at rest, SOB with minimal exertion and prefers to sit, cannot lie down flat, speaks in phrases, mild retractions, audible wheezing, pulse 100-120.    - SEVERE: Very SOB at rest, speaks in single words, struggling to breathe, sitting hunched forward, retractions, pulse > 120      none 6. FEVER: "Do you have a fever?" If Yes, ask: "What is your  temperature, how was it measured, and when did it start?"      7. CARDIAC HISTORY: "Do you have any history of heart disease?" (e.g., heart attack, congestive heart failure)       8. LUNG HISTORY: "Do you have any history of lung disease?"  (e.g., pulmonary embolus, asthma, emphysema)      9. PE RISK FACTORS: "Do you have a history of blood clots?" (or: recent major surgery, recent prolonged travel, bedridden)      10. OTHER SYMPTOMS: "Do you have any other symptoms?" (e.g., runny nose, wheezing, chest pain)  Protocols used: Cough - Acute Productive-A-AH

## 2022-07-26 DIAGNOSIS — R0609 Other forms of dyspnea: Secondary | ICD-10-CM | POA: Diagnosis not present

## 2022-07-26 DIAGNOSIS — R0789 Other chest pain: Secondary | ICD-10-CM | POA: Diagnosis not present

## 2022-07-26 DIAGNOSIS — R42 Dizziness and giddiness: Secondary | ICD-10-CM | POA: Diagnosis not present

## 2022-07-26 LAB — CBC AND DIFFERENTIAL
HCT: 49 (ref 41–53)
Hemoglobin: 16.1 (ref 13.5–17.5)
Platelets: 269 10*3/uL (ref 150–400)
WBC: 10.4

## 2022-07-26 LAB — BASIC METABOLIC PANEL: Potassium: 3.9 mEq/L (ref 3.5–5.1)

## 2022-07-26 LAB — CBC: RBC: 5.04 (ref 3.87–5.11)

## 2022-07-27 ENCOUNTER — Encounter: Payer: Self-pay | Admitting: Family Medicine

## 2022-07-27 ENCOUNTER — Ambulatory Visit: Payer: Self-pay

## 2022-07-27 NOTE — Telephone Encounter (Signed)
Chief Complaint: Oxygen level dropping below 90 Symptoms: cough Frequency: NA Pertinent Negatives: Patient denies SOB Disposition: [] ED /[] Urgent Care (no appt availability in office) / [] Appointment(In office/virtual)/ []  [place] [facility]/ [] Home Care/ [] Refused Recommended Disposition /[] [facility]/ [x]  Follow-up with PCP Additional Notes: Patient called and says he was seen in the ED yesterday and was told his oxygen level was low. He bought a O2 sensor last night and his oxygen is running between 85-91. He's concerned about it dropping in the 80's, asking is that concerning. He's sitting, not doing anything, still coughing, says as he's talking to me it's dropping 88-91. I advised a low oxygen is concerning because a normal oxygen level is above 90. Advised with the illness he has right now a level of 91 of ok, but as he improves it should go up higher than that. However if it's running below 90 and he's still not improving with current symptoms he will need to go back to the ED. He said he doesn't want to go back because they saw his oxygen was low and sent him home, so he doesn't think that would be beneficial. He says he sent a MyChart message to Dr. Brita Romp and was calling to make sure she sees it and responds to it and his wife asks if maybe having oxygen ordered for him would help until he's over this illness and breathing treatments. I advised I will send this to Dr. Brita Romp and someone will call back with her recommendations.   Reason for Disposition  [1] Caller requesting NON-URGENT health information AND [2] PCP's office is the best resource  Answer Assessment - Initial Assessment Questions 1. REASON FOR CALL or QUESTION: "What is your reason for calling today?" or "How can I best help you?" or "What question do you have that I can help answer?"     Oxygen level is running below 90, is that concerning.  Protocols used: Information Only Call - No Triage-A-AH

## 2022-08-04 ENCOUNTER — Ambulatory Visit: Payer: BC Managed Care – PPO | Admitting: Physician Assistant

## 2022-08-04 VITALS — BP 107/69 | HR 70 | Wt 287.4 lb

## 2022-08-04 DIAGNOSIS — R058 Other specified cough: Secondary | ICD-10-CM | POA: Diagnosis not present

## 2022-08-04 DIAGNOSIS — K219 Gastro-esophageal reflux disease without esophagitis: Secondary | ICD-10-CM | POA: Diagnosis not present

## 2022-08-04 DIAGNOSIS — R053 Chronic cough: Secondary | ICD-10-CM

## 2022-08-04 DIAGNOSIS — G4733 Obstructive sleep apnea (adult) (pediatric): Secondary | ICD-10-CM | POA: Diagnosis not present

## 2022-08-04 MED ORDER — OMEPRAZOLE 40 MG PO CPDR: 40.0000 mg | DELAYED_RELEASE_CAPSULE | Freq: Every day | ORAL | 3 refills | Status: DC

## 2022-08-04 MED ORDER — IPRATROPIUM BROMIDE 0.03 % NA SOLN: 2.0000 | Freq: Two times a day (BID) | NASAL | 12 refills | Status: DC

## 2022-08-04 NOTE — Progress Notes (Unsigned)
Established patient visit   Patient: Michael Reyes   DOB: 1955/06/06   67 y.o. Male  MRN: [id number] Visit Date: 08/04/2022  Today's healthcare provider: Debera LatJanna Raelynn Corron, PA-C  CC: persistent cough  Subjective    HPI  Due to gerd Due to allergies Post viral cough syndrome  Medications: Outpatient Medications Prior to Visit  Medication Sig   allopurinol (ZYLOPRIM) 100 MG tablet TAKE 1 TABLET(100 MG) BY MOUTH TWICE DAILY   aspirin 81 MG tablet Take 81 mg by mouth daily.    azithromycin (ZITHROMAX) 250 MG tablet Take 1 tablet (250 mg total) by mouth daily. Take first 2 tablets together, then 1 every day until finished.   chlorpheniramine-HYDROcodone (TUSSIONEX) 10-8 MG/5ML Take 5 mLs by mouth at bedtime as needed for cough.   GLUCOSAMINE-CHONDROIT-VIT C-MN PO Take 1 tablet by mouth daily.    levothyroxine (SYNTHROID) 50 MCG tablet TAKE 1 TABLET(50 MCG) BY MOUTH DAILY   MULTIPLE VITAMIN PO Take 1 tablet by mouth daily.    OMEPRAZOLE PO Take by mouth.   simvastatin (ZOCOR) 40 MG tablet Take 1 tablet (40 mg total) by mouth daily.   No facility-administered medications prior to visit.    Review of Systems  HENT:  Positive for postnasal drip, rhinorrhea and sore throat.     {Labs  Heme  Chem  Endocrine  Serology  Results Review (optional):23779}   Objective    BP 107/69 (BP Location: Right Arm, Patient Position: Sitting, Cuff Size: Large)   Pulse 70   Wt 287 lb 6.4 oz (130.4 kg)   SpO2 98%   BMI 36.90 kg/m  {Show previous vital signs (optional):23777}  Physical Exam Vitals reviewed.  Constitutional:      General: He is not in acute distress.    Appearance: Normal appearance. He is not diaphoretic.  HENT:     Head: Normocephalic and atraumatic.  Eyes:     General: No scleral icterus.    Conjunctiva/sclera: Conjunctivae normal.  Cardiovascular:     Rate and Rhythm: Normal rate and regular rhythm.     Pulses: Normal pulses.     Heart sounds: Normal  heart sounds. No murmur heard. Pulmonary:     Effort: Pulmonary effort is normal. No respiratory distress.     Breath sounds: Normal breath sounds. No wheezing or rhonchi.  Musculoskeletal:     Cervical back: Neck supple.     Right lower leg: No edema.     Left lower leg: No edema.  Lymphadenopathy:     Cervical: No cervical adenopathy.  Skin:    General: Skin is warm and dry.     Findings: No rash.  Neurological:     Mental Status: He is alert and oriented to person, place, and time. Mental status is at baseline.  Psychiatric:        Behavior: Behavior normal.        Thought Content: Thought content normal.     ***  No results found for any visits on 08/04/22.  Assessment & Plan     ***  No follow-ups on file.      The patient was advised to call back or seek an in-person evaluation if the symptoms worsen or if the condition fails to improve as anticipated.  I discussed the assessment and treatment plan with the patient. The patient was provided an opportunity to ask questions and all were answered. The patient agreed with the plan and demonstrated an understanding of the instructions.  I, Debera Lat, PA-C have reviewed all documentation for this visit. The documentation on  08/04/22 for the exam, diagnosis, procedures, and orders are all accurate and complete.  Debera Lat, [facility], MMS [facility] [phone] (phone) [phone] (fax)  [facility]

## 2022-08-07 ENCOUNTER — Other Ambulatory Visit: Payer: Self-pay | Admitting: Family Medicine

## 2022-08-14 DIAGNOSIS — I2510 Atherosclerotic heart disease of native coronary artery without angina pectoris: Secondary | ICD-10-CM | POA: Diagnosis not present

## 2022-08-14 DIAGNOSIS — G4733 Obstructive sleep apnea (adult) (pediatric): Secondary | ICD-10-CM | POA: Diagnosis not present

## 2022-08-14 DIAGNOSIS — R053 Chronic cough: Secondary | ICD-10-CM | POA: Diagnosis not present

## 2022-08-14 DIAGNOSIS — I872 Venous insufficiency (chronic) (peripheral): Secondary | ICD-10-CM | POA: Diagnosis not present

## 2022-08-14 DIAGNOSIS — E782 Mixed hyperlipidemia: Secondary | ICD-10-CM | POA: Diagnosis not present

## 2022-08-14 DIAGNOSIS — I5032 Chronic diastolic (congestive) heart failure: Secondary | ICD-10-CM | POA: Diagnosis not present

## 2022-08-14 LAB — LIPID PANEL
Cholesterol: 164 (ref 0–200)
HDL: 42 (ref 35–70)
LDL Cholesterol: 97
Triglycerides: 126 (ref 40–160)

## 2022-08-17 DIAGNOSIS — I872 Venous insufficiency (chronic) (peripheral): Secondary | ICD-10-CM | POA: Diagnosis not present

## 2022-08-17 DIAGNOSIS — I5032 Chronic diastolic (congestive) heart failure: Secondary | ICD-10-CM | POA: Diagnosis not present

## 2022-08-22 DIAGNOSIS — H43813 Vitreous degeneration, bilateral: Secondary | ICD-10-CM | POA: Diagnosis not present

## 2022-08-22 DIAGNOSIS — H33322 Round hole, left eye: Secondary | ICD-10-CM | POA: Diagnosis not present

## 2022-08-22 DIAGNOSIS — H43393 Other vitreous opacities, bilateral: Secondary | ICD-10-CM | POA: Diagnosis not present

## 2022-08-22 DIAGNOSIS — H59811 Chorioretinal scars after surgery for detachment, right eye: Secondary | ICD-10-CM | POA: Diagnosis not present

## 2022-08-22 DIAGNOSIS — H35372 Puckering of macula, left eye: Secondary | ICD-10-CM | POA: Diagnosis not present

## 2022-08-23 NOTE — Progress Notes (Unsigned)
I,Makhia Vosler S Alizia Greif,acting as a Neurosurgeon for Shirlee Latch, MD.,have documented all relevant documentation on the behalf of Shirlee Latch, MD,as directed by  Shirlee Latch, MD while in the presence of Shirlee Latch, MD.    Annual Wellness Visit     Patient: Michael Reyes, Male    DOB: [birth year], 67 y.o.   MRN: [id number] Visit Date: 08/24/2022  Today's Provider: Shirlee Latch, MD   No chief complaint on file.  Subjective    Michael Reyes is a 67 y.o. male who presents today for his Annual Wellness Visit. He reports consuming a {diet types:17450} diet. {Exercise:19826} He generally feels {well/fairly well/poorly:18703}. He reports sleeping {well/fairly well/poorly:18703}. He {does/does not:200015} have additional problems to discuss today.   HPI    Medications: Outpatient Medications Prior to Visit  Medication Sig   allopurinol (ZYLOPRIM) 100 MG tablet TAKE 1 TABLET(100 MG) BY MOUTH TWICE DAILY   aspirin 81 MG tablet Take 81 mg by mouth daily.    azithromycin (ZITHROMAX) 250 MG tablet Take 1 tablet (250 mg total) by mouth daily. Take first 2 tablets together, then 1 every day until finished.   chlorpheniramine-HYDROcodone (TUSSIONEX) 10-8 MG/5ML Take 5 mLs by mouth at bedtime as needed for cough.   GLUCOSAMINE-CHONDROIT-VIT C-MN PO Take 1 tablet by mouth daily.    ipratropium (ATROVENT) 0.03 % nasal spray Place 2 sprays into both nostrils every 12 (twelve) hours.   levothyroxine (SYNTHROID) 50 MCG tablet TAKE 1 TABLET(50 MCG) BY MOUTH DAILY   MULTIPLE VITAMIN PO Take 1 tablet by mouth daily.    omeprazole (PRILOSEC) 40 MG capsule Take 1 capsule (40 mg total) by mouth daily.   simvastatin (ZOCOR) 40 MG tablet Take 1 tablet (40 mg total) by mouth daily.   No facility-administered medications prior to visit.    No Known Allergies  Patient Care Team: Erasmo Downer, MD as PCP - General (Family Medicine)  Review of Systems  All other  systems reviewed and are negative.   Last CBC Lab Results  Component Value Date   WBC 4.0 03/20/2019   HGB 17.3 03/20/2019   HCT 51.5 (H) 03/20/2019   MCV 96 03/20/2019   MCH 32.3 03/20/2019   RDW 12.4 03/20/2019   PLT 215 03/20/2019   Last metabolic panel Lab Results  Component Value Date   GLUCOSE 103 (H) 08/02/2021   NA 142 08/02/2021   K 4.3 08/02/2021   CL 102 08/02/2021   CO2 24 08/02/2021   BUN 21 08/02/2021   CREATININE 1.04 08/02/2021   EGFR 79 08/02/2021   CALCIUM 9.7 08/02/2021   PROT 7.3 08/02/2021   ALBUMIN 4.7 08/02/2021   LABGLOB 2.6 08/02/2021   AGRATIO 1.8 08/02/2021   BILITOT 0.7 08/02/2021   ALKPHOS 87 08/02/2021   AST 27 08/02/2021   ALT 33 08/02/2021   Last lipids Lab Results  Component Value Date   CHOL 165 08/02/2021   HDL 45 08/02/2021   LDLCALC 89 08/02/2021   TRIG 178 (H) 08/02/2021   CHOLHDL 3.5 03/20/2019   Last hemoglobin A1c Lab Results  Component Value Date   HGBA1C 5.6 08/02/2021   Last thyroid functions Lab Results  Component Value Date   TSH 3.130 08/02/2021   T4TOTAL 4.8 02/29/2016   Last vitamin D Lab Results  Component Value Date   VD25OH 38.2 02/29/2016   Last vitamin B12 and Folate Lab Results  Component Value Date   VITAMINB12 341 12/08/2016        Objective  Vitals: There were no vitals taken for this visit. BP Readings from Last 3 Encounters:  08/04/22 107/69  07/24/22 131/68  07/23/22 101/65   Wt Readings from Last 3 Encounters:  08/04/22 287 lb 6.4 oz (130.4 kg)  07/24/22 294 lb 12.8 oz (133.7 kg)  04/11/22 285 lb (129.3 kg)       Physical Exam ***  Most recent functional status assessment:    07/24/2022   10:21 AM  In your present state of health, do you have any difficulty performing the following activities:  Hearing? 0  Vision? 0  Difficulty concentrating or making decisions? 0  Walking or climbing stairs? 0  Dressing or bathing? 0  Doing errands, shopping? 0   Most  recent fall risk assessment:    07/24/2022   10:21 AM  Fall Risk   Falls in the past year? 0  Number falls in past yr: 0  Injury with Fall? 0  Risk for fall due to : No Fall Risks  Follow up Falls evaluation completed    Most recent depression screenings:    07/24/2022   10:21 AM 08/02/2021   10:52 AM  PHQ 2/9 Scores  PHQ - 2 Score 0 0  PHQ- 9 Score 0 0   Most recent cognitive screening:     No data to display         Most recent Audit-C alcohol use screening    07/24/2022   10:20 AM  Alcohol Use Disorder Test (AUDIT)  1. How often do you have a drink containing alcohol? 2  2. How many drinks containing alcohol do you have on a typical day when you are drinking? 0  3. How often do you have six or more drinks on one occasion? 0  AUDIT-C Score 2   A score of 3 or more in women, and 4 or more in men indicates increased risk for alcohol abuse, EXCEPT if all of the points are from question 1   No results found for any visits on 08/24/22.  Assessment & Plan     Annual wellness visit done today including the all of the following: Reviewed patient's Family Medical History Reviewed and updated list of patient's medical providers Assessment of cognitive impairment was done Assessed patient's functional ability Established a written schedule for health screening services Health Risk Assessent Completed and Reviewed  Exercise Activities and Dietary recommendations  Goals   None     Immunization History  Administered Date(s) Administered   Fluad Quad(high Dose 65+) 01/13/2021, 02/21/2022   Hepatitis A 04/06/2010, 12/09/2010   Hepatitis B, ADULT 03/28/2016, 05/16/2016, 09/27/2016   Influenza, High Dose Seasonal PF 01/08/2020   Influenza,inj,Quad PF,6+ Mos 02/11/2019   Influenza-Unspecified 01/30/2015, 01/09/2017, 02/19/2018   MMR 03/11/2018   Moderna Covid-19 Vaccine Bivalent Booster 22yrs & up 02/21/2022   PFIZER(Purple Top)SARS-COV-2 Vaccination 06/18/2019,  07/09/2019, 02/04/2020, 09/10/2020, 01/15/2021   PNEUMOCOCCAL CONJUGATE-20 08/02/2021   Pneumococcal Polysaccharide-23 01/08/2020   Tdap 11/18/2013   Zoster Recombinat (Shingrix) 03/11/2018, 06/11/2018    Health Maintenance  Topic Date Due   COVID-19 Vaccine (7 - 2023-24 season) 04/18/2022   INFLUENZA VACCINE  11/30/2022   DTaP/Tdap/Td (2 - Td or Tdap) 11/19/2023   COLONOSCOPY (Pts 45-46yrs Insurance coverage will need to be confirmed)  04/11/2029   Pneumonia Vaccine 60+ Years old  Completed   Hepatitis C Screening  Completed   Zoster Vaccines- Shingrix  Completed   HPV VACCINES  Aged Out     Discussed health benefits of physical  activity, and encouraged him to engage in regular exercise appropriate for his age and condition.    ***  No follow-ups on file.     {provider attestation***:1}   Shirlee Latch, MD  [facility] [phone] (phone) [phone] (fax)  [facility]

## 2022-08-24 ENCOUNTER — Encounter: Payer: Self-pay | Admitting: Family Medicine

## 2022-08-24 ENCOUNTER — Ambulatory Visit (INDEPENDENT_AMBULATORY_CARE_PROVIDER_SITE_OTHER): Payer: BC Managed Care – PPO | Admitting: Family Medicine

## 2022-08-24 VITALS — BP 120/72 | HR 88 | Temp 98.0°F | Resp 12 | Wt 284.0 lb

## 2022-08-24 DIAGNOSIS — E7800 Pure hypercholesterolemia, unspecified: Secondary | ICD-10-CM | POA: Diagnosis not present

## 2022-08-24 DIAGNOSIS — M791 Myalgia, unspecified site: Secondary | ICD-10-CM | POA: Diagnosis not present

## 2022-08-24 DIAGNOSIS — I2510 Atherosclerotic heart disease of native coronary artery without angina pectoris: Secondary | ICD-10-CM | POA: Diagnosis not present

## 2022-08-24 DIAGNOSIS — I5030 Unspecified diastolic (congestive) heart failure: Secondary | ICD-10-CM | POA: Insufficient documentation

## 2022-08-24 DIAGNOSIS — E669 Obesity, unspecified: Secondary | ICD-10-CM | POA: Diagnosis not present

## 2022-08-24 DIAGNOSIS — R053 Chronic cough: Secondary | ICD-10-CM

## 2022-08-24 DIAGNOSIS — E039 Hypothyroidism, unspecified: Secondary | ICD-10-CM | POA: Diagnosis not present

## 2022-08-24 DIAGNOSIS — G4733 Obstructive sleep apnea (adult) (pediatric): Secondary | ICD-10-CM

## 2022-08-24 DIAGNOSIS — T466X5A Adverse effect of antihyperlipidemic and antiarteriosclerotic drugs, initial encounter: Secondary | ICD-10-CM

## 2022-08-24 DIAGNOSIS — Z6836 Body mass index (BMI) 36.0-36.9, adult: Secondary | ICD-10-CM

## 2022-08-24 DIAGNOSIS — R739 Hyperglycemia, unspecified: Secondary | ICD-10-CM

## 2022-08-24 DIAGNOSIS — Z0000 Encounter for general adult medical examination without abnormal findings: Secondary | ICD-10-CM

## 2022-08-24 DIAGNOSIS — M1A09X0 Idiopathic chronic gout, multiple sites, without tophus (tophi): Secondary | ICD-10-CM | POA: Insufficient documentation

## 2022-08-24 DIAGNOSIS — I5032 Chronic diastolic (congestive) heart failure: Secondary | ICD-10-CM

## 2022-08-24 DIAGNOSIS — Z125 Encounter for screening for malignant neoplasm of prostate: Secondary | ICD-10-CM | POA: Diagnosis not present

## 2022-08-24 MED ORDER — ROSUVASTATIN CALCIUM 5 MG PO TABS
5.0000 mg | ORAL_TABLET | Freq: Every day | ORAL | 3 refills | Status: DC
Start: 2022-08-24 — End: 2023-05-10

## 2022-08-24 MED ORDER — ALLOPURINOL 100 MG PO TABS: 100.0000 mg | ORAL_TABLET | Freq: Two times a day (BID) | ORAL | 3 refills | Status: DC

## 2022-08-24 MED ORDER — LEVOTHYROXINE SODIUM 50 MCG PO TABS: 50.0000 ug | ORAL_TABLET | Freq: Every day | ORAL | 1 refills | Status: DC

## 2022-08-24 NOTE — Assessment & Plan Note (Signed)
Upcoming sleep re-eval Continue CPAP

## 2022-08-24 NOTE — Assessment & Plan Note (Signed)
Diagnosed by cardiology Continue lasix prn Discussed pros and cons of possible RHC

## 2022-08-24 NOTE — Assessment & Plan Note (Signed)
Reviewed records from cardiology Now established with [facility] Continue statin and aspirin Aggressive risk factor modification Discussed workup with cardiology and reviewed records

## 2022-08-24 NOTE — Assessment & Plan Note (Signed)
Elevated LDL on last lipids Was switched from simvastatin to atorvastatin Now with new myalgias of right ankle, possibly secondary to statin Will switch from atorvastatin to rosuvastatin 5 mg daily New prescription sent Recheck lipids and CMP in 2 months Given history of CAD, goal LDL less than 70

## 2022-08-24 NOTE — Assessment & Plan Note (Signed)
Chronic, multifactorial Lungs are clear on exam Discussed with patient that recent bronchitis likely contirbuting Continue antihistamine Ok to d/c omeprazole as not helping Possible RHC Not on any medications that would be causing this

## 2022-08-24 NOTE — Assessment & Plan Note (Signed)
Previously well controlled Continue Synthroid at current dose  Recheck TSH and adjust Synthroid as indicated   

## 2022-08-24 NOTE — Assessment & Plan Note (Signed)
Discussed importance of healthy weight management Discussed diet and exercise  

## 2022-08-24 NOTE — Assessment & Plan Note (Signed)
Chronic and well controlled Continue allopurinol Recheck uric acid - goal <6

## 2022-08-25 LAB — URIC ACID: Uric Acid: 4.7 mg/dL (ref 3.8–8.4)

## 2022-08-25 LAB — PSA TOTAL (REFLEX TO FREE): Prostate Specific Ag, Serum: 1.6 ng/mL (ref 0.0–4.0)

## 2022-08-25 LAB — HEMOGLOBIN A1C
Est. average glucose Bld gHb Est-mCnc: 126 mg/dL
Hgb A1c MFr Bld: 6 % — ABNORMAL HIGH (ref 4.8–5.6)

## 2022-08-25 LAB — TSH: TSH: 2.65 u[IU]/mL (ref 0.450–4.500)

## 2022-08-30 ENCOUNTER — Encounter: Payer: Self-pay | Admitting: Family Medicine

## 2022-09-11 ENCOUNTER — Telehealth: Payer: Self-pay

## 2022-09-11 NOTE — Telephone Encounter (Signed)
Pt returning Margie call, informed him to bring SD card to appt

## 2022-09-11 NOTE — Telephone Encounter (Signed)
Noted.  Will close encounter.  

## 2022-09-11 NOTE — Telephone Encounter (Signed)
Lm for patient to request that he bring SD card to 09/13/2022 appt.

## 2022-09-13 ENCOUNTER — Encounter: Payer: Self-pay | Admitting: Internal Medicine

## 2022-09-13 ENCOUNTER — Ambulatory Visit (INDEPENDENT_AMBULATORY_CARE_PROVIDER_SITE_OTHER): Payer: BC Managed Care – PPO | Admitting: Internal Medicine

## 2022-09-13 VITALS — BP 124/76 | HR 76 | Temp 97.6°F | Ht 75.0 in | Wt 279.4 lb

## 2022-09-13 DIAGNOSIS — G4733 Obstructive sleep apnea (adult) (pediatric): Secondary | ICD-10-CM

## 2022-09-13 NOTE — Progress Notes (Signed)
Name: Michael Reyes MRN: [id number] DOB: [birth year]    CHIEF COMPLAINT:  EXCESSIVE DAYTIME SLEEPINESS, Assessment of OSA   HISTORY OF PRESENT ILLNESS: Patient is seen today for problems and issues with sleep related to excessive daytime sleepiness Patient  has been having sleep problems for many years Patient diagnosed with severe OSA 12 years ago Repeat home sleep study in 2019 revealed severe sleep apnea with AHI of 88 patient currently on CPAP 4 to 20 cm of water pressure Patient just received a new machine Currently wears nasal mask  Compliance download reviewed in detail with patient AHI fully reduced to 2.1 Patient has very minimal leaks  Have issues with wrap-up times  Discussed sleep data and reviewed with patient.  Encouraged proper weight management.  Discussed driving precautions and its relationship with hypersomnolence.  Discussed operating dangerous equipment and its relationship with hypersomnolence.  Discussed sleep hygiene, and benefits of a fixed sleep waked time.  The importance of getting eight or more hours of sleep discussed with patient.  Discussed limiting the use of the computer and television before bedtime.  Decrease naps during the day, so night time sleep will become enhanced.  Limit caffeine, and sleep deprivation.  HTN, stroke, and heart failure are potential risk factors.     No exacerbation at this time No evidence of heart failure at this time No evidence or signs of infection at this time No respiratory distress No fevers, chills, nausea, vomiting, diarrhea No evidence of lower extremity edema No evidence hemoptysis     PAST MEDICAL HISTORY :   has a past medical history of Arthritis, Coronary artery disease, Hypothyroidism, Pre-diabetes, and Sleep apnea.  has a past surgical history that includes Vasectomy; Knee surgery (Right, 12/2013); Colonoscopy (2012); Finger surgery (Left, 01/2016); and Colonoscopy with propofol (N/A,  04/11/2022). Prior to Admission medications   Medication Sig Start Date End Date Taking? Authorizing Provider  allopurinol (ZYLOPRIM) 100 MG tablet Take 1 tablet (100 mg total) by mouth 2 (two) times daily. 08/24/22  Yes Bacigalupo, Marzella Schlein, MD  aspirin 81 MG tablet Take 81 mg by mouth daily.  06/17/08  Yes [provider]  GLUCOSAMINE-CHONDROIT-VIT C-MN PO Take 1 tablet by mouth daily.    Yes [provider]  levothyroxine (SYNTHROID) 50 MCG tablet Take 1 tablet (50 mcg total) by mouth daily before breakfast. 08/24/22  Yes Bacigalupo, Marzella Schlein, MD  MULTIPLE VITAMIN PO Take 1 tablet by mouth daily.    Yes [provider]  rosuvastatin (CRESTOR) 5 MG tablet Take 1 tablet (5 mg total) by mouth daily. 08/24/22  Yes Bacigalupo, Marzella Schlein, MD   No Known Allergies  FAMILY HISTORY:  family history includes Cancer in his sister; Diabetes in his father and sister; Healthy in his brother; Heart disease in his father; Obesity in his sister; Tongue cancer in his mother. SOCIAL HISTORY:  reports that he has never smoked. He has never used smokeless tobacco. He reports current alcohol use. He reports that he does not use drugs.   Review of Systems:  Gen:  Denies  fever, sweats, chills weight loss  HEENT: Denies blurred vision, double vision, ear pain, eye pain, hearing loss, nose bleeds, sore throat Cardiac:  No dizziness, chest pain or heaviness, chest tightness,edema, No JVD Resp:   No cough, -sputum production, -shortness of breath,-wheezing, -hemoptysis,  Gi: Denies swallowing difficulty, stomach pain, nausea or vomiting, diarrhea, constipation, bowel incontinence Gu:  Denies bladder incontinence, burning urine Ext:   Denies Joint  pain, stiffness or swelling Skin: Denies  skin rash, easy bruising or bleeding or hives Endoc:  Denies polyuria, polydipsia , polyphagia or weight change Psych:   Denies depression, insomnia or hallucinations  Other:  All other systems  negative   ALL OTHER ROS ARE NEGATIVE   BP 124/76 (BP Location: Left Arm, Cuff Size: Normal)   Pulse 76   Temp 97.6 F (36.4 C) (Temporal)   Ht 6\' 3"  (1.905 m)   Wt 279 lb 6.4 oz (126.7 kg)   SpO2 95%   BMI 34.92 kg/m     Physical Examination:   General Appearance: No distress  EYES PERRLA, EOM intact.   NECK Supple, No JVD Pulmonary: normal breath sounds, No wheezing.  CardiovascularNormal S1,S2.  No m/r/g.   Abdomen: Benign, Soft, non-tender. Skin:   warm, no rashes, no ecchymosis  Extremities: normal, no cyanosis, clubbing. Neuro:without focal findings,  speech normal  PSYCHIATRIC: Mood, affect within normal limits.   ALL OTHER ROS ARE NEGATIVE    ASSESSMENT AND PLAN SYNOPSIS  67 year old pleasant white male seen today for assessment of severe sleep apnea  Compliance report reviewed in detail with the patient 100% compliance for days and greater than 4 hours Patient uses and benefits from therapy OSA well-controlled AHI reduced to 2.1 previous AHI 88    Obesity -recommend significant weight loss -recommend changing diet  Deconditioned state -Recommend increased daily activity and exercise   MEDICATION ADJUSTMENTS/LABS AND TESTS ORDERED: Continue CPAP as prescribed  CALL DME company for ramp up times  Avoid secondhand smoke Avoid SICK contacts Recommend  Masking  when appropriate Recommend Keep up-to-date with vaccinations   CURRENT MEDICATIONS REVIEWED AT LENGTH WITH PATIENT TODAY   Patient  satisfied with Plan of action and management. All questions answered  Follow up  3 months  Total Time Spent  35 mins   Wallis Bamberg Santiago Glad, M.D.  [facility]  Medical Director [facility] Medical Director [facility] Cardio-Pulmonary Department

## 2022-09-13 NOTE — Patient Instructions (Signed)
Continue CPAP as prescribed-EXCELLENT JOB A+  CALL DME company for ramp up times  Avoid secondhand smoke Avoid SICK contacts Recommend  Masking  when appropriate Recommend Keep up-to-date with vaccinations

## 2022-09-20 DIAGNOSIS — H43393 Other vitreous opacities, bilateral: Secondary | ICD-10-CM | POA: Diagnosis not present

## 2022-09-20 DIAGNOSIS — H43813 Vitreous degeneration, bilateral: Secondary | ICD-10-CM | POA: Diagnosis not present

## 2022-09-20 DIAGNOSIS — H35372 Puckering of macula, left eye: Secondary | ICD-10-CM | POA: Diagnosis not present

## 2022-09-20 DIAGNOSIS — H31012 Macula scars of posterior pole (postinflammatory) (post-traumatic), left eye: Secondary | ICD-10-CM | POA: Diagnosis not present

## 2022-10-09 DIAGNOSIS — Z79899 Other long term (current) drug therapy: Secondary | ICD-10-CM | POA: Diagnosis not present

## 2022-10-09 DIAGNOSIS — I2510 Atherosclerotic heart disease of native coronary artery without angina pectoris: Secondary | ICD-10-CM | POA: Diagnosis not present

## 2022-10-09 DIAGNOSIS — Z6834 Body mass index (BMI) 34.0-34.9, adult: Secondary | ICD-10-CM | POA: Diagnosis not present

## 2022-10-09 DIAGNOSIS — G4733 Obstructive sleep apnea (adult) (pediatric): Secondary | ICD-10-CM | POA: Diagnosis not present

## 2022-10-09 DIAGNOSIS — I5032 Chronic diastolic (congestive) heart failure: Secondary | ICD-10-CM | POA: Diagnosis not present

## 2022-10-09 DIAGNOSIS — E669 Obesity, unspecified: Secondary | ICD-10-CM | POA: Diagnosis not present

## 2022-10-09 DIAGNOSIS — R9431 Abnormal electrocardiogram [ECG] [EKG]: Secondary | ICD-10-CM | POA: Diagnosis not present

## 2022-10-12 DIAGNOSIS — L0889 Other specified local infections of the skin and subcutaneous tissue: Secondary | ICD-10-CM | POA: Diagnosis not present

## 2022-10-24 ENCOUNTER — Encounter: Payer: Self-pay | Admitting: Physician Assistant

## 2022-10-24 ENCOUNTER — Ambulatory Visit: Payer: BC Managed Care – PPO | Admitting: Physician Assistant

## 2022-10-24 ENCOUNTER — Ambulatory Visit: Payer: Self-pay

## 2022-10-24 VITALS — BP 110/68 | HR 88 | Temp 97.9°F | Resp 16 | Ht 75.0 in | Wt 268.2 lb

## 2022-10-24 DIAGNOSIS — S91312A Laceration without foreign body, left foot, initial encounter: Secondary | ICD-10-CM

## 2022-10-24 DIAGNOSIS — Z23 Encounter for immunization: Secondary | ICD-10-CM

## 2022-10-24 MED ORDER — SULFAMETHOXAZOLE-TRIMETHOPRIM 800-160 MG PO TABS
1.0000 | ORAL_TABLET | Freq: Two times a day (BID) | ORAL | 0 refills | Status: AC
Start: 2022-10-24 — End: 2022-11-03

## 2022-10-24 NOTE — Telephone Encounter (Signed)
     Chief Complaint: Laceration top of left foot. Red, swollen, draining clear fluid.Dropped metal on foot. Symptoms: Above Frequency: 2 weeks ago Pertinent Negatives: Patient denies fever Disposition: [] ED /[] Urgent Care (no appt availability in office) / [x] Appointment(In office/virtual)/ []  [place] [facility]/ [] Home Care/ [] Refused Recommended Disposition /[] [place] [facility]/ []  Follow-up with PCP Additional Notes: Agrees with disposition.  Reason for Disposition  [1] Looks infected (spreading redness, pus) AND [2] no fever  Answer Assessment - Initial Assessment Questions 1. APPEARANCE of INJURY: "What does the injury look like?"      Cut 2. SIZE: "How large is the cut?"      3 inches long 3. BLEEDING: "Is it bleeding now?" If Yes, ask: "Is it difficult to stop?"      No 4. LOCATION: "Where is the injury located?"      Left foot top of foot 5. ONSET: "How long ago did the injury occur?"      2 weeks ago 6. MECHANISM: "Tell me how it happened."      Dropped metal on it 7. TETANUS: "When was the last tetanus booster?"     Unsure 8. PREGNANCY: "Is there any chance you are pregnant?" "When was your last menstrual period?"     N/a  Protocols used: Cuts and Lacerations-A-AH

## 2022-10-24 NOTE — Patient Instructions (Signed)
Keep the area clean with warm water and a gentle soap You can let the area stay open the air as long as you are not at risk of contaminants being in the wound You can use Aquaphor or Vaseline to help keep it moisturized  You can use a non-stick guaze and/or bandage if you need to keep it covered   If you notice the following please schedule a follow up: swelling, increased redness, drainage or pus, streaks from the wound area, fever, increased pain or tenderness

## 2022-10-24 NOTE — Progress Notes (Signed)
Acute Office Visit   Patient: Michael Reyes   DOB: [birth year]   67 y.o. Male  MRN: [id number] Visit Date: 10/24/2022  Today's healthcare provider: Oswaldo Conroy Elecia Serafin, PA-C  Introduced myself to the patient as a Secondary school teacher and provided education on APPs in clinical practice.    Chief Complaint  Patient presents with   Laceration    Happened on 10/01/22, Left foot possible infected, red, pus and itches. Pt did a tele visit and was given Keflex.   Subjective    HPI HPI     Laceration    Additional comments: Happened on 10/01/22, Left foot possible infected, red, pus and itches. Pt did a tele visit and was given Keflex.      Last edited by Forde Radon, CMA on 10/24/2022  9:50 AM.       He reports he dropped a piece of metal on his foot on 10/01/22  He had a televisit on 10/12/22  He has finished a week long course of Keflex  He reports he went on vacation at the beach and was swimming in the ocean He denies pain with foot flexion or movement and reports minimal tenderness to the wound area    Medications: Outpatient Medications Prior to Visit  Medication Sig   allopurinol (ZYLOPRIM) 100 MG tablet Take 1 tablet (100 mg total) by mouth 2 (two) times daily.   aspirin 81 MG tablet Take 81 mg by mouth daily.    GLUCOSAMINE-CHONDROIT-VIT C-MN PO Take 1 tablet by mouth daily.    levothyroxine (SYNTHROID) 50 MCG tablet Take 1 tablet (50 mcg total) by mouth daily before breakfast.   MULTIPLE VITAMIN PO Take 1 tablet by mouth daily.    rosuvastatin (CRESTOR) 5 MG tablet Take 1 tablet (5 mg total) by mouth daily.   No facility-administered medications prior to visit.    Review of Systems  Skin:  Positive for wound (wound of left dorsum of foot).       Objective    BP 110/68   Pulse 88   Temp 97.9 F (36.6 C) (Oral)   Resp 16   Ht 6\' 3"  (1.905 m)   Wt 268 lb 3.2 oz (121.7 kg)   SpO2 93%   BMI 33.52 kg/m    Physical Exam Vitals reviewed.   Constitutional:      General: He is awake.     Appearance: Normal appearance. He is well-developed and well-groomed.  HENT:     Head: Normocephalic and atraumatic.  Pulmonary:     Effort: Pulmonary effort is normal.  Skin:    Findings: Erythema, laceration and wound present. No bruising.  Neurological:     Mental Status: He is alert.  Psychiatric:        Behavior: Behavior is cooperative.      Media Information   Document Information  Photos    10/24/2022 10:10  Attached To:  Office Visit on 10/24/22 with Gaylin Osoria, Oswaldo Conroy, PA-C  Source Information  Kwinton Maahs, Oswaldo Conroy, PA-C  [facility] Information   Document Information  Photos     10/24/2022 10:11  Attached To:  Office Visit on 10/24/22 with Kael Keetch, Oswaldo Conroy, PA-C  Source Information  Alfretta Pinch, Oswaldo Conroy, PA-C  [facility]   No results found for any visits on 10/24/22.  Assessment & Plan      No follow-ups on file.  Problem List Items Addressed This Visit   None Visit Diagnoses     Laceration of dorsum of left foot    -  Primary Acute, ongoing concern Patient reports an ongoing, nonhealing laceration to the dorsum of his left foot caused by dropping a piece of metal onto it 22 days ago (10/01/2022) He reports that he was seen via televisit on 10/12/2022 and was prescribed a weeklong course of Keflex which she has now finished. Patient provided photo of initial laceration which demonstrated a thin, shallow appearing cut approximately 6 to 7 cm long Photos of current wound are included in physical exam findings Today I recommend starting a course of Bactrim p.o. twice daily x 10 days.   Patient's Tdap booster was provided today given mechanism of injury We also reviewed care measures for home which include: Cleansing the area with warm water and gentle soap, allowing it to air dry, using Aquaphor or Vaseline to keep the area moist, covering the area with a nonstick gauze and tape as  needed to prevent contamination. I do not recommend using alcohol, peroxide, Neosporin, harsh cleansers We reviewed signs of worsening infection as well as ED precautions-patient voiced understanding and agreement If symptoms are not improving with second course of antibiotics I recommend that he be evaluated by wound care for potential debridement and further management Follow-up as needed for progressing or persistent symptoms   Relevant Medications   sulfamethoxazole-trimethoprim (BACTRIM DS) 800-160 MG tablet   Other Relevant Orders   Tdap vaccine greater than or equal to 7yo IM (Completed)        No follow-ups on file.   I, Alandria Butkiewicz E Maritssa Haughton, PA-C, have reviewed all documentation for this visit. The documentation on 10/24/22 for the exam, diagnosis, procedures, and orders are all accurate and complete.   Jacquelin Hawking, MHS, PA-C [facility]

## 2022-11-03 DIAGNOSIS — G4733 Obstructive sleep apnea (adult) (pediatric): Secondary | ICD-10-CM | POA: Diagnosis not present

## 2022-11-09 ENCOUNTER — Other Ambulatory Visit: Payer: Self-pay | Admitting: Family Medicine

## 2022-11-09 DIAGNOSIS — E7800 Pure hypercholesterolemia, unspecified: Secondary | ICD-10-CM

## 2022-11-09 NOTE — Telephone Encounter (Signed)
Unable to refill per protocol, Rx expired. Discontinued 08/24/22.  Requested Prescriptions  Pending Prescriptions Disp Refills   simvastatin (ZOCOR) 40 MG tablet [Pharmacy Med Name: SIMVASTATIN 40MG  TABLETS] 90 tablet 3    Sig: TAKE 1 TABLET(40 MG) BY MOUTH DAILY     Cardiovascular:  Antilipid - Statins Failed - 11/09/2022  3:43 AM      Failed - Lipid Panel in normal range within the last 12 months    Cholesterol, Total  Date Value Ref Range Status  08/02/2021 165 100 - 199 mg/dL Final   Cholesterol  Date Value Ref Range Status  08/14/2022 164 0 - 200 Final   LDL Chol Calc (NIH)  Date Value Ref Range Status  08/02/2021 89 0 - 99 mg/dL Final   LDL Cholesterol  Date Value Ref Range Status  08/14/2022 97  Final   HDL  Date Value Ref Range Status  08/14/2022 42 35 - 70 Final  08/02/2021 45 >39 mg/dL Final   Triglycerides  Date Value Ref Range Status  08/14/2022 126 40 - 160 Final         Passed - Patient is not pregnant      Passed - Valid encounter within last 12 months    Recent Outpatient Visits           2 weeks ago Laceration of dorsum of left foot   [facility] [place], Oswaldo Conroy, PA-C   2 months ago Encounter for annual physical exam   [facility] [place], Marzella Schlein, MD   3 months ago Gastroesophageal reflux disease without esophagitis    [facility] [place], [place], PA-C   3 months ago Acute bronchitis, unspecified organism   [facility] Alfredia Ferguson, PA-C   1 year ago Toe injury, unspecified laterality, initial encounter   [facility] [place], [place], [place]

## 2022-11-10 ENCOUNTER — Other Ambulatory Visit: Payer: Self-pay | Admitting: Family Medicine

## 2022-11-23 ENCOUNTER — Encounter: Payer: Self-pay | Admitting: Family Medicine

## 2022-11-23 DIAGNOSIS — Z789 Other specified health status: Secondary | ICD-10-CM

## 2022-11-23 DIAGNOSIS — E7800 Pure hypercholesterolemia, unspecified: Secondary | ICD-10-CM

## 2022-11-23 DIAGNOSIS — R739 Hyperglycemia, unspecified: Secondary | ICD-10-CM

## 2022-11-24 NOTE — Telephone Encounter (Signed)
Needs A1c, lipids, CMP, B12 (use HLD, prediabetes and vegan diet for dx codes)

## 2022-11-27 DIAGNOSIS — R739 Hyperglycemia, unspecified: Secondary | ICD-10-CM | POA: Diagnosis not present

## 2022-11-27 DIAGNOSIS — Z789 Other specified health status: Secondary | ICD-10-CM | POA: Diagnosis not present

## 2022-11-27 DIAGNOSIS — E7800 Pure hypercholesterolemia, unspecified: Secondary | ICD-10-CM | POA: Diagnosis not present

## 2022-11-30 DIAGNOSIS — G4733 Obstructive sleep apnea (adult) (pediatric): Secondary | ICD-10-CM | POA: Diagnosis not present

## 2022-11-30 DIAGNOSIS — R0602 Shortness of breath: Secondary | ICD-10-CM | POA: Diagnosis not present

## 2022-11-30 DIAGNOSIS — I5030 Unspecified diastolic (congestive) heart failure: Secondary | ICD-10-CM | POA: Diagnosis not present

## 2022-11-30 DIAGNOSIS — E669 Obesity, unspecified: Secondary | ICD-10-CM | POA: Diagnosis not present

## 2022-11-30 DIAGNOSIS — Z6832 Body mass index (BMI) 32.0-32.9, adult: Secondary | ICD-10-CM | POA: Diagnosis not present

## 2022-11-30 DIAGNOSIS — R0609 Other forms of dyspnea: Secondary | ICD-10-CM | POA: Diagnosis not present

## 2022-11-30 DIAGNOSIS — I2510 Atherosclerotic heart disease of native coronary artery without angina pectoris: Secondary | ICD-10-CM | POA: Diagnosis not present

## 2022-11-30 DIAGNOSIS — I5032 Chronic diastolic (congestive) heart failure: Secondary | ICD-10-CM | POA: Diagnosis not present

## 2022-11-30 DIAGNOSIS — Z5181 Encounter for therapeutic drug level monitoring: Secondary | ICD-10-CM | POA: Diagnosis not present

## 2022-12-02 ENCOUNTER — Encounter: Payer: Self-pay | Admitting: Family Medicine

## 2022-12-14 DIAGNOSIS — H33322 Round hole, left eye: Secondary | ICD-10-CM | POA: Diagnosis not present

## 2022-12-14 DIAGNOSIS — T8522XA Displacement of intraocular lens, initial encounter: Secondary | ICD-10-CM | POA: Diagnosis not present

## 2022-12-14 DIAGNOSIS — Z8669 Personal history of other diseases of the nervous system and sense organs: Secondary | ICD-10-CM | POA: Diagnosis not present

## 2022-12-14 DIAGNOSIS — Z961 Presence of intraocular lens: Secondary | ICD-10-CM | POA: Diagnosis not present

## 2022-12-15 DIAGNOSIS — H31012 Macula scars of posterior pole (postinflammatory) (post-traumatic), left eye: Secondary | ICD-10-CM | POA: Diagnosis not present

## 2022-12-15 DIAGNOSIS — H43813 Vitreous degeneration, bilateral: Secondary | ICD-10-CM | POA: Diagnosis not present

## 2022-12-15 DIAGNOSIS — H43393 Other vitreous opacities, bilateral: Secondary | ICD-10-CM | POA: Diagnosis not present

## 2022-12-15 DIAGNOSIS — T8522XA Displacement of intraocular lens, initial encounter: Secondary | ICD-10-CM | POA: Diagnosis not present

## 2022-12-20 DIAGNOSIS — H33311 Horseshoe tear of retina without detachment, right eye: Secondary | ICD-10-CM | POA: Diagnosis not present

## 2022-12-20 DIAGNOSIS — T8522XA Displacement of intraocular lens, initial encounter: Secondary | ICD-10-CM | POA: Diagnosis not present

## 2022-12-28 DIAGNOSIS — H33311 Horseshoe tear of retina without detachment, right eye: Secondary | ICD-10-CM | POA: Diagnosis not present

## 2023-01-06 DIAGNOSIS — E039 Hypothyroidism, unspecified: Secondary | ICD-10-CM | POA: Diagnosis not present

## 2023-01-06 DIAGNOSIS — E785 Hyperlipidemia, unspecified: Secondary | ICD-10-CM | POA: Diagnosis not present

## 2023-01-06 DIAGNOSIS — Z7984 Long term (current) use of oral hypoglycemic drugs: Secondary | ICD-10-CM | POA: Diagnosis not present

## 2023-01-06 DIAGNOSIS — Z833 Family history of diabetes mellitus: Secondary | ICD-10-CM | POA: Diagnosis not present

## 2023-01-06 DIAGNOSIS — Z809 Family history of malignant neoplasm, unspecified: Secondary | ICD-10-CM | POA: Diagnosis not present

## 2023-01-06 DIAGNOSIS — E669 Obesity, unspecified: Secondary | ICD-10-CM | POA: Diagnosis not present

## 2023-01-06 DIAGNOSIS — I509 Heart failure, unspecified: Secondary | ICD-10-CM | POA: Diagnosis not present

## 2023-01-06 DIAGNOSIS — Z6831 Body mass index (BMI) 31.0-31.9, adult: Secondary | ICD-10-CM | POA: Diagnosis not present

## 2023-01-06 DIAGNOSIS — Z008 Encounter for other general examination: Secondary | ICD-10-CM | POA: Diagnosis not present

## 2023-01-06 DIAGNOSIS — I739 Peripheral vascular disease, unspecified: Secondary | ICD-10-CM | POA: Diagnosis not present

## 2023-01-06 DIAGNOSIS — Z7982 Long term (current) use of aspirin: Secondary | ICD-10-CM | POA: Diagnosis not present

## 2023-01-06 DIAGNOSIS — Z8249 Family history of ischemic heart disease and other diseases of the circulatory system: Secondary | ICD-10-CM | POA: Diagnosis not present

## 2023-01-06 DIAGNOSIS — M109 Gout, unspecified: Secondary | ICD-10-CM | POA: Diagnosis not present

## 2023-01-26 ENCOUNTER — Other Ambulatory Visit: Payer: Self-pay | Admitting: Family Medicine

## 2023-01-26 NOTE — Telephone Encounter (Signed)
Requested Prescriptions  Pending Prescriptions Disp Refills   allopurinol (ZYLOPRIM) 100 MG tablet [Pharmacy Med Name: ALLOPURINOL 100 MG TAB] 180 tablet 3    Sig: TAKE ONE TABLET BY MOUTH TWICE DAILY PT NEEDS APPT FOR FURTHER REFILLS     Endocrinology:  Gout Agents - allopurinol Failed - 01/26/2023 12:47 PM      Failed - CBC within normal limits and completed in the last 12 months    WBC  Date Value Ref Range Status  07/26/2022 10.4  Final   RBC  Date Value Ref Range Status  07/26/2022 5.04 3.87 - 5.11 Final   Hemoglobin  Date Value Ref Range Status  07/26/2022 16.1 13.5 - 17.5 Final  03/20/2019 17.3 13.0 - 17.7 g/dL Final   HCT  Date Value Ref Range Status  07/26/2022 49 41 - 53 Final   Hematocrit  Date Value Ref Range Status  03/20/2019 51.5 (H) 37.5 - 51.0 % Final   MCHC  Date Value Ref Range Status  03/20/2019 33.6 31.5 - 35.7 g/dL Final   [facility]  Date Value Ref Range Status  03/20/2019 32.3 26.6 - 33.0 pg Final   MCV  Date Value Ref Range Status  03/20/2019 96 79 - 97 fL Final   No results found for: "PLTCOUNTKUC", "LABPLAT", "POCPLA" RDW  Date Value Ref Range Status  03/20/2019 12.4 11.6 - 15.4 % Final         Passed - Uric Acid in normal range and within 360 days    Uric Acid  Date Value Ref Range Status  08/24/2022 4.7 3.8 - 8.4 mg/dL Final    Comment:               Therapeutic target for gout patients: <6.0         Passed - Cr in normal range and within 360 days    Creatinine, Ser  Date Value Ref Range Status  11/27/2022 1.02 0.76 - 1.27 mg/dL Final         Passed - Valid encounter within last 12 months    Recent Outpatient Visits           3 months ago Laceration of dorsum of left foot   [facility] [place], Oswaldo Conroy, PA-C   5 months ago Encounter for annual physical exam   [facility] [place], Marzella Schlein, MD   5 months ago Gastroesophageal reflux disease without esophagitis     [facility], [place], PA-C   6 months ago Acute bronchitis, unspecified organism   [facility] Alfredia Ferguson, PA-C   1 year ago Toe injury, unspecified laterality, initial encounter   [facility] [place], [place], [place]

## 2023-02-01 DIAGNOSIS — T8522XD Displacement of intraocular lens, subsequent encounter: Secondary | ICD-10-CM | POA: Diagnosis not present

## 2023-02-01 DIAGNOSIS — H35371 Puckering of macula, right eye: Secondary | ICD-10-CM | POA: Diagnosis not present

## 2023-02-01 DIAGNOSIS — H33311 Horseshoe tear of retina without detachment, right eye: Secondary | ICD-10-CM | POA: Diagnosis not present

## 2023-02-01 DIAGNOSIS — Z9889 Other specified postprocedural states: Secondary | ICD-10-CM | POA: Diagnosis not present

## 2023-02-09 ENCOUNTER — Telehealth: Payer: Self-pay

## 2023-02-09 NOTE — Telephone Encounter (Signed)
Called patient about the PAD screening he had done at home through his insurance and advised of Dr. B recommendation to have a ABI if he agrees.   Per patient just had a Cath heart over at [facility] and reports that they measure all type of pressures.

## 2023-02-12 NOTE — Telephone Encounter (Signed)
LMTCB

## 2023-02-12 NOTE — Telephone Encounter (Signed)
They don't do an ABI during a cath, but he can discuss it with the cardiologist and they may be able to complete in their office

## 2023-02-20 NOTE — Telephone Encounter (Signed)
Per patient he was going to talk it over with his Cardiologist.

## 2023-03-26 DIAGNOSIS — G4733 Obstructive sleep apnea (adult) (pediatric): Secondary | ICD-10-CM | POA: Diagnosis not present

## 2023-03-26 DIAGNOSIS — I5032 Chronic diastolic (congestive) heart failure: Secondary | ICD-10-CM | POA: Diagnosis not present

## 2023-03-26 DIAGNOSIS — I2510 Atherosclerotic heart disease of native coronary artery without angina pectoris: Secondary | ICD-10-CM | POA: Diagnosis not present

## 2023-04-12 DIAGNOSIS — I5032 Chronic diastolic (congestive) heart failure: Secondary | ICD-10-CM | POA: Diagnosis not present

## 2023-04-19 DIAGNOSIS — H43392 Other vitreous opacities, left eye: Secondary | ICD-10-CM | POA: Diagnosis not present

## 2023-04-19 DIAGNOSIS — H43812 Vitreous degeneration, left eye: Secondary | ICD-10-CM | POA: Diagnosis not present

## 2023-04-19 DIAGNOSIS — H31093 Other chorioretinal scars, bilateral: Secondary | ICD-10-CM | POA: Diagnosis not present

## 2023-04-19 DIAGNOSIS — H35373 Puckering of macula, bilateral: Secondary | ICD-10-CM | POA: Diagnosis not present

## 2023-04-24 DIAGNOSIS — Z87442 Personal history of urinary calculi: Secondary | ICD-10-CM | POA: Diagnosis not present

## 2023-04-24 DIAGNOSIS — R112 Nausea with vomiting, unspecified: Secondary | ICD-10-CM | POA: Diagnosis not present

## 2023-04-24 DIAGNOSIS — R109 Unspecified abdominal pain: Secondary | ICD-10-CM | POA: Diagnosis not present

## 2023-04-24 DIAGNOSIS — R1031 Right lower quadrant pain: Secondary | ICD-10-CM | POA: Diagnosis not present

## 2023-04-26 ENCOUNTER — Other Ambulatory Visit: Payer: Self-pay | Admitting: Family Medicine

## 2023-04-30 NOTE — Telephone Encounter (Signed)
Confirm with patient which pharmacy. Last rx sent to [facility] for 1 year supply

## 2023-05-10 ENCOUNTER — Other Ambulatory Visit: Payer: Self-pay

## 2023-05-10 DIAGNOSIS — I5032 Chronic diastolic (congestive) heart failure: Secondary | ICD-10-CM

## 2023-05-10 MED ORDER — ROSUVASTATIN CALCIUM 5 MG PO TABS: 5.0000 mg | ORAL_TABLET | Freq: Every day | ORAL | 3 refills | Status: DC

## 2023-05-10 MED ORDER — LEVOTHYROXINE SODIUM 50 MCG PO TABS: 50.0000 ug | ORAL_TABLET | Freq: Every day | ORAL | 3 refills | Status: DC

## 2023-05-10 MED ORDER — DAPAGLIFLOZIN PROPANEDIOL 10 MG PO TABS: 10.0000 mg | ORAL_TABLET | Freq: Every day | ORAL | 3 refills | Status: DC

## 2023-05-10 MED ORDER — EZETIMIBE 10 MG PO TABS: 10.0000 mg | ORAL_TABLET | Freq: Every day | ORAL | 3 refills | Status: DC

## 2023-05-10 MED ORDER — SPIRONOLACTONE 25 MG PO TABS: 25.0000 mg | ORAL_TABLET | Freq: Every day | ORAL | 3 refills | Status: DC

## 2023-05-21 ENCOUNTER — Encounter: Payer: Self-pay | Admitting: Family Medicine

## 2023-05-21 MED ORDER — DESONIDE 0.05 % EX OINT: 1.0000 | TOPICAL_OINTMENT | Freq: Two times a day (BID) | CUTANEOUS | 1 refills | Status: DC

## 2023-06-28 ENCOUNTER — Other Ambulatory Visit: Payer: Self-pay | Admitting: Family Medicine

## 2023-06-29 NOTE — Telephone Encounter (Signed)
 Requested Prescriptions  Refused Prescriptions Disp Refills   allopurinol (ZYLOPRIM) 100 MG tablet [Pharmacy Med Name: ALLOPURINOL 100 MG TAB] 180 tablet 1    Sig: TAKE ONE TABLET BY MOUTH TWICE DAILY     Endocrinology:  Gout Agents - allopurinol Failed - 06/29/2023  4:07 PM      Failed - CBC within normal limits and completed in the last 12 months    WBC  Date Value Ref Range Status  07/26/2022 10.4  Final   RBC  Date Value Ref Range Status  07/26/2022 5.04 3.87 - 5.11 Final   Hemoglobin  Date Value Ref Range Status  07/26/2022 16.1 13.5 - 17.5 Final  03/20/2019 17.3 13.0 - 17.7 g/dL Final   HCT  Date Value Ref Range Status  07/26/2022 49 41 - 53 Final   Hematocrit  Date Value Ref Range Status  03/20/2019 51.5 (H) 37.5 - 51.0 % Final   MCHC  Date Value Ref Range Status  03/20/2019 33.6 31.5 - 35.7 g/dL Final   [facility]  Date Value Ref Range Status  03/20/2019 32.3 26.6 - 33.0 pg Final   MCV  Date Value Ref Range Status  03/20/2019 96 79 - 97 fL Final   No results found for: "PLTCOUNTKUC", "LABPLAT", "POCPLA" RDW  Date Value Ref Range Status  03/20/2019 12.4 11.6 - 15.4 % Final         Passed - Uric Acid in normal range and within 360 days    Uric Acid  Date Value Ref Range Status  08/24/2022 4.7 3.8 - 8.4 mg/dL Final    Comment:               Therapeutic target for gout patients: <6.0         Passed - Cr in normal range and within 360 days    Creatinine, Ser  Date Value Ref Range Status  11/27/2022 1.02 0.76 - 1.27 mg/dL Final         Passed - Valid encounter within last 12 months    Recent Outpatient Visits           8 months ago Laceration of dorsum of left foot   [facility] [place], Oswaldo Conroy, PA-C   10 months ago Encounter for annual physical exam   [facility] [place], Marzella Schlein, MD   10 months ago Gastroesophageal reflux disease without esophagitis   [facility]  [place], [place], PA-C   11 months ago Acute bronchitis, unspecified organism   [facility] Alfredia Ferguson, PA-C   1 year ago Toe injury, unspecified laterality, initial encounter   [facility] [place], [place], [place]

## 2023-07-26 ENCOUNTER — Other Ambulatory Visit: Payer: Self-pay | Admitting: Family Medicine

## 2023-08-19 ENCOUNTER — Other Ambulatory Visit: Payer: Self-pay

## 2023-08-19 ENCOUNTER — Encounter: Payer: Self-pay | Admitting: Emergency Medicine

## 2023-08-19 DIAGNOSIS — I2510 Atherosclerotic heart disease of native coronary artery without angina pectoris: Secondary | ICD-10-CM | POA: Diagnosis not present

## 2023-08-19 DIAGNOSIS — L03211 Cellulitis of face: Secondary | ICD-10-CM | POA: Diagnosis not present

## 2023-08-19 DIAGNOSIS — E039 Hypothyroidism, unspecified: Secondary | ICD-10-CM | POA: Diagnosis not present

## 2023-08-19 NOTE — ED Triage Notes (Addendum)
  Patient comes in with L ear swelling/pain that he first noticed Saturday morning when he woke up.  Denies any injury and no obvious bug bite.  Left ear is swollen, red, and tender to touch.  Denies any inner ear issues.  No fevers at home.  Recent overseas travel in the last 2 weeks.  Pain 5/10, tender.  Took 400 mg ibuprofen for pain around 2100.

## 2023-08-20 ENCOUNTER — Emergency Department
Admission: EM | Admit: 2023-08-20 | Discharge: 2023-08-20 | Disposition: A | Discharge status: Home / Self Care | Attending: Emergency Medicine | Admitting: Emergency Medicine

## 2023-08-20 ENCOUNTER — Ambulatory Visit: Payer: Self-pay

## 2023-08-20 DIAGNOSIS — L03211 Cellulitis of face: Secondary | ICD-10-CM

## 2023-08-20 MED ORDER — DOXYCYCLINE HYCLATE 100 MG PO TABS
100.0000 mg | ORAL_TABLET | Freq: Once | ORAL | Status: AC
  Administered 2023-08-20: 100 mg via ORAL
  Filled 2023-08-20: qty 1

## 2023-08-20 MED ORDER — DOXYCYCLINE HYCLATE 100 MG PO TABS: 100.0000 mg | ORAL_TABLET | Freq: Two times a day (BID) | ORAL | 0 refills | Status: AC

## 2023-08-20 NOTE — Discharge Instructions (Addendum)
 Take doxycycline  antibiotic as prescribed for the full 7-day course Be aware that this makes you sensitive to the sun so may have plenty of sunscreen and protective clothing when out in the sun.  Take acetaminophen  650 mg and ibuprofen 400 mg every 6 hours for pain.  Take with food.    If you see any signs of infection like spreading redness, pus coming from the wound, extreme pain, fevers, chills or any other worsening doctor right away or come back to the emergency department Thank you for choosing us  for your health care today!  Please see your primary doctor this week for a follow up appointment.   If you have any new, worsening, or unexpected symptoms call your doctor right away or come back to the emergency department for reevaluation.  It was my pleasure to care for you today.   Arron Large Margery Sheets, MD

## 2023-08-20 NOTE — ED Provider Notes (Signed)
 [facility] Provider Note    Event Date/Time   First MD Initiated Contact with Patient 08/20/23 0030     (approximate)   History   Ear Injury   HPI  Michael Reyes is a 68 y.o. male   Past medical history of CAD, hypothyroid, prediabetic presents emerged apartment with a skin rash to the left ear and left side of his face.  It started in the earlobe perhaps a bug bite or a pimple that was slightly itchy and irritated and then the ear swelled up felt warm and is spread to his face.  He denies fevers or chills.  No vision changes.     Physical Exam   Triage Vital Signs: ED Triage Vitals  Encounter Vitals Group     BP 08/19/23 2304 138/82     Systolic BP Percentile --      Diastolic BP Percentile --      Pulse Rate 08/19/23 2304 86     Resp 08/19/23 2304 17     Temp 08/19/23 2304 98.8 F (37.1 C)     Temp src --      SpO2 08/19/23 2304 97 %     Weight 08/19/23 2308 234 lb (106.1 kg)     Height 08/19/23 2308 6\' 3"  (1.905 m)     Head Circumference --      Peak Flow --      Pain Score 08/19/23 2307 5     Pain Loc --      Pain Education --      Exclude from Growth Chart --     Most recent vital signs: Vitals:   08/19/23 2304  BP: 138/82  Pulse: 86  Resp: 17  Temp: 98.8 F (37.1 C)  SpO2: 97%    General: Awake, no distress.  CV:  Good peripheral perfusion.  Resp:  Normal effort.  Abd:  No distention.  Other:  He has some cellulitis changes to his left ear which appears swollen and warm, and it spreads to the left side of his face just anterior to the air, below the ear as well.  Ear canal looks okay and his TM looks normal.  Extraocular movements intact no proptosis and no evidence of preseptal cellulitis or orbital cellulitis   ED Results / Procedures / Treatments   Labs (all labs ordered are listed, but only abnormal results are displayed) Labs Reviewed - No data to display PROCEDURES:  Critical Care performed:  No  Procedures   MEDICATIONS ORDERED IN ED: Medications  doxycycline  (VIBRA -TABS) tablet 100 mg (has no administration in time range)     IMPRESSION / MDM / ASSESSMENT AND PLAN / ED COURSE  I reviewed the triage vital signs and the nursing notes.                                Patient's presentation is most consistent with acute complicated illness / injury requiring diagnostic workup.  Differential diagnosis includes, but is not limited to, cellulitis, considered but less likely herpes infection, orbital cellulitis or preseptal cellulitis, sepsis   MDM:    Looks like was started as an earlobe pimple or insect bite has now spread into cellulitis encompassing the ear and part of the face.  He looks nontoxic and I doubt sepsis given his normal vital signs and well appearance.  It does not look like herpes infection.  It does not look  like preseptal or orbital cellulitis.  I will start him on doxycycline  given prescription for the same and he will follow-up with his doctor.       FINAL CLINICAL IMPRESSION(S) / ED DIAGNOSES   Final diagnoses:  Cellulitis, face     Rx / DC Orders   ED Discharge Orders          Ordered    doxycycline  (VIBRA -TABS) 100 MG tablet  2 times daily        08/20/23 0109             Note:  This document was prepared using Dragon voice recognition software and may include unintentional dictation errors.    Buell Carmin, MD 08/20/23 0111

## 2023-08-21 ENCOUNTER — Telehealth: Payer: Self-pay

## 2023-08-21 NOTE — Transitions of Care (Post Inpatient/ED Visit) (Unsigned)
   08/21/2023  Name: Michael Reyes MRN: [id number] DOB: 11-06-1955  Today's TOC FU Call Status: Today's TOC FU Call Status:: Unsuccessful Call (1st Attempt) Unsuccessful Call (1st Attempt) Date: 08/21/23  Attempted to reach the patient regarding the most recent Inpatient/ED visit.  Follow Up Plan: Additional outreach attempts will be made to reach the patient to complete the Transitions of Care (Post Inpatient/ED visit) call.   Signature Darrall Ellison, LPN [facility] Nurse Health Advisor Direct Dial [phone]

## 2023-08-22 NOTE — Transitions of Care (Post Inpatient/ED Visit) (Signed)
 08/22/2023  Name: Michael Reyes MRN: [id number] DOB: March 23, 1956  Today's TOC FU Call Status: Today's TOC FU Call Status:: Successful TOC FU Call Completed Unsuccessful Call (1st Attempt) Date: 08/21/23 [facility] FU Call Complete Date: 08/22/23 Patient's Name and Date of Birth confirmed.  Transition Care Management Follow-up Telephone Call Date of Discharge: 08/21/23 Discharge Facility[facility]) Type of Discharge: Emergency Department Reason for ED Visit: Other: (cellutitis of face) How have you been since you were released from the hospital?: Better Any questions or concerns?: No  Items Reviewed: Did you receive and understand the discharge instructions provided?: Yes Medications obtained,verified, and reconciled?: Yes (Medications Reviewed) Any new allergies since your discharge?: No Dietary orders reviewed?: Yes Do you have support at home?: Yes People in Home [RPT]: spouse  Medications Reviewed Today: Medications Reviewed Today     Reviewed by Darrall Ellison, LPN (Licensed Practical Nurse) on 08/22/23 at 1114  Med List Status: <None>   Medication Order Taking? Sig Documenting Provider Last Dose Status Informant  allopurinol  (ZYLOPRIM ) 100 MG tablet [id number]  TAKE ONE TABLET BY MOUTH TWICE DAILY Bacigalupo, Angela M, MD  Active   aspirin 81 MG tablet [id number] No Take 81 mg by mouth daily.  [provider] Taking Active            Med Note Alvy Baar, Kriste Petite   Tue Jun 11, 2018 10:01 AM)    dapagliflozin  propanediol (FARXIGA ) 10 MG TABS tablet [id number]  Take 1 tablet (10 mg total) by mouth daily. Mazie Speed, MD  Active   desonide  (DESOWEN ) 0.05 % ointment [id number]  Apply 1 Application topically 2 (two) times daily. Mazie Speed, MD  Active   doxycycline  (VIBRA -TABS) 100 MG tablet [id number]  Take 1 tablet (100 mg total) by mouth 2 (two) times daily for 7 days. Buell Carmin, MD  Active   ezetimibe  (ZETIA ) 10 MG  tablet [id number]  Take 1 tablet (10 mg total) by mouth daily. Mazie Speed, MD  Active   GLUCOSAMINE-CHONDROIT-VIT C-MN PO [id number] No Take 1 tablet by mouth daily.  [provider] Taking Active            Med Note Linzie Rickers   Tue Jun 11, 2018 10:01 AM)    levothyroxine  (SYNTHROID ) 50 MCG tablet [id number]  Take 1 tablet (50 mcg total) by mouth daily before breakfast. Bacigalupo, Angela M, MD  Active   MULTIPLE VITAMIN PO [id number] No Take 1 tablet by mouth daily.  [provider] Taking Active            Med Note Linzie Rickers   Tue Jun 11, 2018 10:01 AM)    rosuvastatin  (CRESTOR ) 5 MG tablet [id number]  Take 1 tablet (5 mg total) by mouth daily. Bacigalupo, Angela M, MD  Active   spironolactone  (ALDACTONE ) 25 MG tablet [id number]  Take 1 tablet (25 mg total) by mouth daily. Mazie Speed, MD  Active             Home Care and Equipment/Supplies: Were Home Health Services Ordered?: NA Any new equipment or medical supplies ordered?: NA  Functional Questionnaire: Do you need assistance with bathing/showering or dressing?: No Do you need assistance with meal preparation?: No Do you need assistance with eating?: No Do you have difficulty maintaining continence: No Do you need assistance with getting out of bed/getting out of a chair/moving?: No Do you have difficulty managing or taking your medications?: No  Follow up appointments  reviewed: PCP Follow-up appointment confirmed?: No (declined) MD Provider Line Number:[phone] Given: No Specialist Hospital Follow-up appointment confirmed?: NA Do you need transportation to your follow-up appointment?: No Do you understand care options if your condition(s) worsen?: Yes-patient verbalized understanding    SIGNATURE Darrall Ellison, LPN [facility] Nurse Health Advisor Direct Dial [phone]

## 2023-08-31 DIAGNOSIS — G4733 Obstructive sleep apnea (adult) (pediatric): Secondary | ICD-10-CM | POA: Diagnosis not present

## 2023-10-01 DIAGNOSIS — I5032 Chronic diastolic (congestive) heart failure: Secondary | ICD-10-CM | POA: Diagnosis not present

## 2023-10-01 DIAGNOSIS — R7303 Prediabetes: Secondary | ICD-10-CM | POA: Diagnosis not present

## 2023-10-17 ENCOUNTER — Ambulatory Visit
Admission: RE | Admit: 2023-10-17 | Discharge: 2023-10-17 | Disposition: A | Discharge status: Home / Self Care | Source: Ambulatory Visit | Attending: Emergency Medicine | Admitting: Emergency Medicine

## 2023-10-17 VITALS — BP 128/44 | HR 64 | Temp 97.4°F | Resp 17

## 2023-10-17 DIAGNOSIS — L03032 Cellulitis of left toe: Secondary | ICD-10-CM

## 2023-10-17 MED ORDER — SULFAMETHOXAZOLE-TRIMETHOPRIM 800-160 MG PO TABS: 1.0000 | ORAL_TABLET | Freq: Two times a day (BID) | ORAL | 0 refills | Status: DC

## 2023-10-17 NOTE — Discharge Instructions (Addendum)
 Take the Bactrim as directed.  Follow up with a podiatrist such as the one listed below.

## 2023-10-17 NOTE — ED Provider Notes (Addendum)
 Michael Reyes    CSN: [id number] Arrival date & time: 10/17/23  1104      History   Chief Complaint Chief Complaint  Patient presents with   Toe Injury    I have an infection in my left big toe.  It has been a lingering problem for two weeks.  It is not getting better. - Entered by patient    HPI Michael Reyes is a 68 y.o. male.  Patient presents with 2-week history of pain, swelling, redness of his right great toe.  He had an ingrown toenail on the left side of his nail which he clipped.  The area of the ingrown toenail improved but he developed his current symptoms at the left base of his toenail.  He has been soaking it in Epsom salt.  No fever, chills, drainage, numbness, weakness.  The history is provided by the patient and medical records.    Past Medical History:  Diagnosis Date   Arthritis    Coronary artery disease    Hypothyroidism    Pre-diabetes    Sleep apnea     Patient Active Problem List   Diagnosis Date Noted   (HFpEF) heart failure with preserved ejection fraction (HCC) 08/24/2022   Chronic gout of multiple sites 08/24/2022   Adenomatous polyp of colon 04/11/2022   History of prediabetes 01/13/2021   Dribbling following urination 12/08/2016   Ganglion cyst of finger of left hand 12/08/2016   History of colonic polyps 02/24/2016   Obesity 02/24/2016   Allergic rhinitis 10/01/2015   Cough 10/01/2015   Venous insufficiency of leg 07/27/2015   Anxiety 04/02/2015   Coronary artery disease involving native coronary artery of native heart without angina pectoris 04/02/2015   Dermatitis, eczematoid 04/02/2015   Hypercholesteremia 04/02/2015   Disorder of mitral valve 04/02/2015   Hypothyroidism 04/02/2015   Heart valve disease 04/02/2015   OSA (obstructive sleep apnea) 03/30/2014   Arthritis due to gout 05/08/2008   ED (erectile dysfunction) of organic origin 05/05/2008    Past Surgical History:  Procedure Laterality Date    COLONOSCOPY  2012   COLONOSCOPY WITH PROPOFOL  N/A 04/11/2022   Procedure: COLONOSCOPY WITH PROPOFOL ;  Surgeon: Luke Salaam, MD;  Location: [facility] ENDOSCOPY;  Service: Gastroenterology;  Laterality: N/A;   FINGER SURGERY Left 01/2016   cyst removal    KNEE SURGERY Right 12/2013   VASECTOMY         Home Medications    Prior to Admission medications   Medication Sig Start Date End Date Taking? Authorizing Provider  sulfamethoxazole -trimethoprim  (BACTRIM  DS) 800-160 MG tablet Take 1 tablet by mouth 2 (two) times daily for 7 days. 10/17/23 10/24/23 Yes Wellington Half, NP  allopurinol  (ZYLOPRIM ) 100 MG tablet TAKE ONE TABLET BY MOUTH TWICE DAILY 07/26/23   Bacigalupo, Angela M, MD  aspirin 81 MG tablet Take 81 mg by mouth daily.  06/17/08   [provider]  dapagliflozin  propanediol (FARXIGA ) 10 MG TABS tablet Take 1 tablet (10 mg total) by mouth daily. 05/10/23   Bacigalupo, Angela M, MD  desonide  (DESOWEN ) 0.05 % ointment Apply 1 Application topically 2 (two) times daily. 05/21/23   Mazie Speed, MD  ezetimibe  (ZETIA ) 10 MG tablet Take 1 tablet (10 mg total) by mouth daily. 05/10/23   Mazie Speed, MD  GLUCOSAMINE-CHONDROIT-VIT C-MN PO Take 1 tablet by mouth daily.     [provider]  levothyroxine  (SYNTHROID ) 50 MCG tablet Take 1 tablet (50 mcg total) by mouth  daily before breakfast. 05/10/23   Bacigalupo, Stan Eans, MD  MULTIPLE VITAMIN PO Take 1 tablet by mouth daily.     [provider]  rosuvastatin  (CRESTOR ) 5 MG tablet Take 1 tablet (5 mg total) by mouth daily. 05/10/23   Bacigalupo, Angela M, MD  spironolactone  (ALDACTONE ) 25 MG tablet Take 1 tablet (25 mg total) by mouth daily. 05/10/23   Bacigalupo, Stan Eans, MD    Family History Family History  Problem Relation Age of Onset   Tongue cancer Mother    Heart disease Father    Diabetes Father    Obesity Sister    Cancer Sister    Diabetes Sister    Healthy Brother     Social History Social History    Tobacco Use   Smoking status: Never   Smokeless tobacco: Never  Vaping Use   Vaping status: Never Used  Substance Use Topics   Alcohol use: Yes    Alcohol/week: 0.0 standard drinks of alcohol    Comment: Occasionally   Drug use: No     Allergies   Patient has no known allergies.   Review of Systems Review of Systems  Constitutional:  Negative for chills and fever.  Musculoskeletal:  Negative for arthralgias and joint swelling.  Skin:  Positive for color change. Negative for wound.  Neurological:  Negative for weakness and numbness.     Physical Exam Triage Vital Signs ED Triage Vitals  Encounter Vitals Group     BP 10/17/23 1132 (!) 128/44     Girls Systolic BP Percentile --      Girls Diastolic BP Percentile --      Boys Systolic BP Percentile --      Boys Diastolic BP Percentile --      Pulse Rate 10/17/23 1132 64     Resp 10/17/23 1132 17     Temp 10/17/23 1132 (!) 97.4 F (36.3 C)     Temp src --      SpO2 10/17/23 1132 95 %     Weight --      Height --      Head Circumference --      Peak Flow --      Pain Score 10/17/23 1142 1     Pain Loc --      Pain Education --      Exclude from Growth Chart --    No data found.  Updated Vital Signs BP (!) 128/44   Pulse 64   Temp (!) 97.4 F (36.3 C)   Resp 17   SpO2 95%   Visual Acuity Right Eye Distance:   Left Eye Distance:   Bilateral Distance:    Right Eye Near:   Left Eye Near:    Bilateral Near:     Physical Exam Constitutional:      General: He is not in acute distress. HENT:     Mouth/Throat:     Mouth: Mucous membranes are moist.   Cardiovascular:     Rate and Rhythm: Normal rate and regular rhythm.  Pulmonary:     Effort: Pulmonary effort is normal. No respiratory distress.   Musculoskeletal:       Feet:  Feet:     Comments: Paronychia of left great toe at left base of nail.   Neurological:     Mental Status: He is alert.      UC Treatments / Results  Labs (all  labs ordered are listed, but only abnormal results are displayed)  Labs Reviewed - No data to display  EKG   Radiology No results found.  Procedures Incision and Drainage  Date/Time: 10/17/2023 12:05 PM  Performed by: Wellington Half, NP Authorized by: Wellington Half, NP   Consent:    Consent obtained:  Verbal   Consent given by:  Patient   Risks discussed:  Bleeding, incomplete drainage, infection and pain Universal protocol:    Procedure explained and questions answered to patient or proxy's satisfaction: yes   Location:    Indications for incision and drainage: Paronychia.   Location:  Lower extremity   Lower extremity location:  Toe   Toe location:  L big toe Pre-procedure details:    Skin preparation:  Povidone-iodine Anesthesia:    Anesthesia method:  Local infiltration   Local anesthetic:  Lidocaine  1% w/o epi Procedure type:    Complexity:  Simple Procedure details:    Incision types:  Stab incision   Drainage:  Bloody   Drainage amount:  Scant   Wound treatment:  Wound left open Post-procedure details:    Procedure completion:  Tolerated well, no immediate complications  (including critical care time)  Medications Ordered in UC Medications - No data to display  Initial Impression / Assessment and Plan / UC Course  I have reviewed the triage vital signs and the nursing notes.  Pertinent labs & imaging results that were available during my care of the patient were reviewed by me and considered in my medical decision making (see chart for details).    Paronychia of left great toe.  I&D performed with return of scant bloody drainage; no purulent drainage.  Treating with Bactrim  (patient took doxycycline  in April 2025 for cellulitis).  Instructed patient to follow-up with a podiatrist.  Contact information for on-call podiatrist provided.  Education provided on paronychia.  He agrees to plan of care.  Final Clinical Impressions(s) / UC Diagnoses   Final  diagnoses:  Paronychia of great toe of left foot     Discharge Instructions      Take the Bactrim  as directed.  Follow up with a podiatrist such as the one listed below.       ED Prescriptions     Medication Sig Dispense Auth. Provider   sulfamethoxazole -trimethoprim  (BACTRIM  DS) 800-160 MG tablet Take 1 tablet by mouth 2 (two) times daily for 7 days. 14 tablet Wellington Half, NP      PDMP not reviewed this encounter.   Wellington Half, NP 10/17/23 1213    Wellington Half, NP 10/17/23 1225

## 2023-10-17 NOTE — ED Triage Notes (Signed)
 Patient reports that he had an ingrown toenail that he tried to remove two weeks ago. Patient now complains of pain, swelling and infection in left great toe. Has soaked epsom salt with no relief. Rates pain 1/10.

## 2023-10-22 ENCOUNTER — Telehealth: Admitting: Physician Assistant

## 2023-10-22 DIAGNOSIS — L03039 Cellulitis of unspecified toe: Secondary | ICD-10-CM

## 2023-10-22 MED ORDER — DOXYCYCLINE HYCLATE 100 MG PO TABS: 100.0000 mg | ORAL_TABLET | Freq: Two times a day (BID) | ORAL | 0 refills | Status: AC

## 2023-10-22 NOTE — Progress Notes (Signed)
 E-Visit for Cellulitis  We are sorry that you are not feeling well. Here is how we plan to help!  Based on what you shared with me it looks like you have Paronychia.  I have prescribed:  Doxycycline  100mg  Take 1 tablet by mouth twice daily for 10 days  HOME CARE:  Take your medications as ordered and take all of them, even if the skin irritation appears to be healing.   GET HELP RIGHT AWAY IF:  Symptoms that don't begin to go away within 48 hours. Severe redness persists or worsens If the area turns color, spreads or swells. If it blisters and opens, develops yellow-brown crust or bleeds. You develop a fever or chills. If the pain increases or becomes unbearable.  Are unable to keep fluids and food down.  MAKE SURE YOU   Understand these instructions. Will watch your condition. Will get help right away if you are not doing well or get worse.  Thank you for choosing an e-visit.  Your e-visit answers were reviewed by a board certified advanced clinical practitioner to complete your personal care plan. Depending upon the condition, your plan could have included both over the counter or prescription medications.  Please review your pharmacy choice. Make sure the pharmacy is open so you can pick up prescription now. If there is a problem, you may contact your provider through [employer] and have the prescription routed to another pharmacy.  Your safety is important to us . If you have drug allergies check your prescription carefully.   For the next 24 hours you can use MyChart to ask questions about today's visit, request a non-urgent call back, or ask for a work or school excuse. You will get an email in the next two days asking about your experience. I hope that your e-visit has been valuable and will speed your recovery.     I have spent 5 minutes in review of e-visit questionnaire, review and updating patient chart, medical decision making and response to patient.    Michael CHRISTELLA Dickinson, PA-C

## 2023-10-30 ENCOUNTER — Ambulatory Visit

## 2023-10-30 DIAGNOSIS — K5730 Diverticulosis of large intestine without perforation or abscess without bleeding: Secondary | ICD-10-CM | POA: Insufficient documentation

## 2023-10-30 DIAGNOSIS — K648 Other hemorrhoids: Secondary | ICD-10-CM | POA: Insufficient documentation

## 2023-10-30 DIAGNOSIS — R635 Abnormal weight gain: Secondary | ICD-10-CM | POA: Insufficient documentation

## 2023-10-30 DIAGNOSIS — Z1211 Encounter for screening for malignant neoplasm of colon: Secondary | ICD-10-CM | POA: Insufficient documentation

## 2023-10-30 DIAGNOSIS — L600 Ingrowing nail: Secondary | ICD-10-CM | POA: Diagnosis not present

## 2023-10-30 NOTE — Progress Notes (Signed)
  Subjective:  Patient ID: Yahir Tavano, male    DOB: 1955/06/16,  MRN: [id number]  Chief Complaint  Patient presents with   Nail Problem    left foot big toe infected    68 y.o. male presents with concern for ingrown nail on the left great toe.  He was referred from his primary care.  They placed him on Bactrim  and subsequently on doxycycline .  He has noted some drainage redness and pain in the outside border of the left great toenail.  Past Medical History:  Diagnosis Date   Arthritis    Coronary artery disease    Hypothyroidism    Pre-diabetes    Sleep apnea     No Known Allergies  ROS: Negative except as per HPI above  Objective:  General: AAO x3, NAD  Dermatological: Incurvation is present along the lateral nail border of the left great toe. There is localized edema without any erythema or increase in warmth around the nail border. There is no drainage or pus. There is no ascending cellulitis. No malodor. No open lesions or pre-ulcerative lesions.    Vascular:  Dorsalis Pedis artery and Posterior Tibial artery pedal pulses are 2/4 bilateral.  Capillary fill time < 3 sec to all digits.   Neruologic: Grossly intact via light touch bilateral. Protective threshold intact to all sites bilateral.   Musculoskeletal: No gross boney pedal deformities bilateral. No pain, crepitus, or limitation noted with foot and ankle range of motion bilateral. Muscular strength 5/5 in all groups tested bilateral.  Gait: Unassisted, Nonantalgic.   No images are attached to the encounter.   Assessment:   1. Ingrown nail of great toe of left foot      Plan:  Patient was evaluated and treated and all questions answered.  Ingrown Nail, left -Patient elects to proceed with minor surgery to remove ingrown toenail today. Consent reviewed and signed by patient. -Ingrown nail excised. See procedure note. -Educated on post-procedure care including soaking. Written instructions provided  and reviewed. -Finish course of oral antibiotics doxycycline  that he has and then monitor off antibiotics -Patient to follow up in 2 weeks for nail check.  Procedure: Excision of Ingrown Toenail Location: Left 1st toe lateral nail borders. Anesthesia: Lidocaine  1% plain; 1.5 mL and Marcaine 0.5% plain; 1.5 mL, digital block. Skin Prep: Betadine. Dressing: Silvadene; telfa; dry, sterile, compression dressing. Technique: Following skin prep, the toe was exsanguinated and a tourniquet was secured at the base of the toe. The affected nail border was freed, split with a nail splitter, and excised. Chemical matrixectomy was then performed with phenol and irrigated out with alcohol (if phenol) or vinegar (if NaOH). The tourniquet was then removed and sterile dressing applied. Disposition: Patient tolerated procedure well. Patient to return in 2 weeks for follow-up.    Return if symptoms worsen or fail to improve.          Marolyn JULIANNA Honour, DPM [facility] / [facility]

## 2023-10-30 NOTE — Patient Instructions (Addendum)

## 2023-12-03 DIAGNOSIS — G4733 Obstructive sleep apnea (adult) (pediatric): Secondary | ICD-10-CM | POA: Diagnosis not present

## 2023-12-04 ENCOUNTER — Ambulatory Visit: Admitting: Family Medicine

## 2023-12-04 ENCOUNTER — Encounter: Admitting: Family Medicine

## 2023-12-04 ENCOUNTER — Encounter: Payer: Self-pay | Admitting: Family Medicine

## 2023-12-04 VITALS — BP 109/68 | HR 70 | Resp 16 | Ht 75.0 in | Wt 242.0 lb

## 2023-12-04 DIAGNOSIS — I5032 Chronic diastolic (congestive) heart failure: Secondary | ICD-10-CM

## 2023-12-04 DIAGNOSIS — E7800 Pure hypercholesterolemia, unspecified: Secondary | ICD-10-CM

## 2023-12-04 DIAGNOSIS — Z0001 Encounter for general adult medical examination with abnormal findings: Secondary | ICD-10-CM | POA: Diagnosis not present

## 2023-12-04 DIAGNOSIS — E039 Hypothyroidism, unspecified: Secondary | ICD-10-CM | POA: Diagnosis not present

## 2023-12-04 DIAGNOSIS — B372 Candidiasis of skin and nail: Secondary | ICD-10-CM

## 2023-12-04 DIAGNOSIS — Z125 Encounter for screening for malignant neoplasm of prostate: Secondary | ICD-10-CM | POA: Diagnosis not present

## 2023-12-04 DIAGNOSIS — Z0000 Encounter for general adult medical examination without abnormal findings: Secondary | ICD-10-CM

## 2023-12-04 MED ORDER — NYSTATIN 100000 UNIT/GM EX OINT: 1.0000 | TOPICAL_OINTMENT | Freq: Two times a day (BID) | CUTANEOUS | 0 refills | Status: AC

## 2023-12-04 MED ORDER — DESONIDE 0.05 % EX OINT: 1.0000 | TOPICAL_OINTMENT | Freq: Two times a day (BID) | CUTANEOUS | 1 refills | Status: AC

## 2023-12-04 NOTE — Progress Notes (Signed)
 Medicare Initial Preventative Physical Exam    Patient: Michael Reyes, Male    DOB: 31-Jan-1956, 68 y.o.   MRN: [id number] Visit Date: 12/04/2023  Today's Provider: Jon Eva, MD   Chief Complaint  Patient presents with   Annual Exam    WTM,. Pts wants to discuss an issue with his tailbone,wt loss ( gaining/losing concern), Memory loss, and concerns about wounds not healing properly.   Subjective    Medicare Initial Preventative Physical Exam Michael Reyes is a 68 y.o. male who presents today for his Initial Preventative Physical Exam.   Discussed the use of AI scribe software for clinical note transcription with the patient, who gave verbal consent to proceed.  History of Present Illness   Michael Reyes is a 68 year old male with heart failure who presents for a Welcome to Medicare visit.  He has a skin growth near his tailbone, described as a two-inch long ridge that is slightly squishy and feels like a healed wound. He applies niacin cream and Desitin with minimal improvement. The area feels unusual but does not bleed.  He has experienced significant weight loss from 280 to 230 pounds on a strict diet but is now gaining weight despite maintaining the same diet, which now includes more meat. His activity level remains unchanged.  He was diagnosed with heart failure and has undergone extensive testing. His A1c is normal, and kidney function and electrolytes are good. Occasional leg swelling has improved with weight loss.  He experiences slower wound healing and frequent infections with minor cuts, using Neosporin for care. He uses tea tree oil for mosquito bites but is cautious about its estrogen content.         Social History   Socioeconomic History   Marital status: Married    Spouse name: Not on file   Number of children: 2   Years of education: PHD   Highest education level: Doctorate  Occupational History   Occupation:  Professor  Tobacco Use   Smoking status: Never   Smokeless tobacco: Never  Vaping Use   Vaping status: Never Used  Substance and Sexual Activity   Alcohol use: Yes    Alcohol/week: 0.0 standard drinks of alcohol    Comment: Occasionally   Drug use: No   Sexual activity: Yes  Other Topics Concern   Not on file  Social History Narrative   Not on file   Social Drivers of Health   Financial Resource Strain: Low Risk  (12/03/2023)   Overall Financial Resource Strain (CARDIA)    Difficulty of Paying Living Expenses: Not hard at all  Food Insecurity: No Food Insecurity (12/03/2023)   Hunger Vital Sign    Worried About Running Out of Food in the Last Year: Never true    Ran Out of Food in the Last Year: Never true  Transportation Needs: No Transportation Needs (12/03/2023)   PRAPARE - Administrator, Civil Service (Medical): No    Lack of Transportation (Non-Medical): No  Physical Activity: Inactive (12/03/2023)   Exercise Vital Sign    Days of Exercise per Week: 0 days    Minutes of Exercise per Session: Not on file  Stress: No Stress Concern Present (12/03/2023)   [employer] of Occupational Health - Occupational Stress Questionnaire    Feeling of Stress: Not at all  Social Connections: Moderately Isolated (12/03/2023)   Social Connection and Isolation Panel    Frequency of Communication with Friends and  Family: Once a week    Frequency of Social Gatherings with Friends and Family: Once a week    Attends Religious Services: Never    Database administrator or Organizations: Yes    Attends Engineer, structural: More than 4 times per year    Marital Status: Married  Catering manager Violence: Not on file    Past Medical History:  Diagnosis Date   Arthritis    Coronary artery disease    Hypothyroidism    Pre-diabetes    Sleep apnea      Patient Active Problem List   Diagnosis Date Noted   Abnormal weight gain 10/30/2023   Colon cancer screening  10/30/2023   Diverticulosis of large intestine without perforation or abscess without bleeding 10/30/2023   Internal hemorrhoids 10/30/2023   (HFpEF) heart failure with preserved ejection fraction (HCC) 08/24/2022   Chronic gout of multiple sites 08/24/2022   Adenomatous polyp of colon 04/11/2022   History of prediabetes 01/13/2021   Dribbling following urination 12/08/2016   Ganglion cyst of finger of left hand 12/08/2016   History of colonic polyps 02/24/2016   Obesity 02/24/2016   Allergic rhinitis 10/01/2015   Cough 10/01/2015   Venous insufficiency of leg 07/27/2015   Anxiety 04/02/2015   Coronary artery disease involving native coronary artery of native heart without angina pectoris 04/02/2015   Dermatitis, eczematoid 04/02/2015   Hypercholesteremia 04/02/2015   Disorder of mitral valve 04/02/2015   Hypothyroidism 04/02/2015   Heart valve disease 04/02/2015   OSA (obstructive sleep apnea) 03/30/2014   Arthritis due to gout 05/08/2008   ED (erectile dysfunction) of organic origin 05/05/2008    Past Surgical History:  Procedure Laterality Date   COLONOSCOPY  2012   COLONOSCOPY WITH PROPOFOL  N/A 04/11/2022   Procedure: COLONOSCOPY WITH PROPOFOL ;  Surgeon: Therisa Bi, MD;  Location: [facility] ENDOSCOPY;  Service: Gastroenterology;  Laterality: N/A;   FINGER SURGERY Left 01/2016   cyst removal    KNEE SURGERY Right 12/2013   VASECTOMY      His family history includes Cancer in his sister; Diabetes in his father and sister; Healthy in his brother; Heart disease in his father; Obesity in his sister; Tongue cancer in his mother.   Current Outpatient Medications:    allopurinol  (ZYLOPRIM ) 100 MG tablet, TAKE ONE TABLET BY MOUTH TWICE DAILY, Disp: 180 tablet, Rfl: 1   aspirin 81 MG tablet, Take 81 mg by mouth daily. , Disp: , Rfl:    dapagliflozin  propanediol (FARXIGA ) 10 MG TABS tablet, Take 1 tablet (10 mg total) by mouth daily., Disp: 90 tablet, Rfl: 3   doxycycline   (VIBRA -TABS) 100 MG tablet, Take 1 tablet (100 mg total) by mouth 2 (two) times daily., Disp: 20 tablet, Rfl: 0   ezetimibe  (ZETIA ) 10 MG tablet, Take 1 tablet (10 mg total) by mouth daily., Disp: 90 tablet, Rfl: 3   GLUCOSAMINE-CHONDROIT-VIT C-MN PO, Take 1 tablet by mouth daily. , Disp: , Rfl:    levothyroxine  (SYNTHROID ) 50 MCG tablet, Take 1 tablet (50 mcg total) by mouth daily before breakfast., Disp: 90 tablet, Rfl: 3   MULTIPLE VITAMIN PO, Take 1 tablet by mouth daily. , Disp: , Rfl:    nystatin  ointment (MYCOSTATIN ), Apply 1 Application topically 2 (two) times daily., Disp: 30 g, Rfl: 0   rosuvastatin  (CRESTOR ) 5 MG tablet, Take 1 tablet (5 mg total) by mouth daily., Disp: 90 tablet, Rfl: 3   spironolactone  (ALDACTONE ) 25 MG tablet, Take 1 tablet (25 mg total)  by mouth daily., Disp: 90 tablet, Rfl: 3   desonide  (DESOWEN ) 0.05 % ointment, Apply 1 Application topically 2 (two) times daily., Disp: 60 g, Rfl: 1   Patient Care Team: Myrla Jon HERO, MD as PCP - General (Family Medicine)  Review of Systems     Objective    Vitals: BP 109/68 (BP Location: Right Arm, Patient Position: Sitting)   Pulse 70   Resp 16   Ht 6' 3 (1.905 m)   Wt 242 lb (109.8 kg)   SpO2 98%   BMI 30.25 kg/m  Vision Screening   Right eye Left eye Both eyes  Without correction   20/20  With correction      Physical Exam Vitals reviewed.  Constitutional:      General: He is not in acute distress.    Appearance: Normal appearance. He is well-developed. He is not diaphoretic.  HENT:     Head: Normocephalic and atraumatic.     Right Ear: Tympanic membrane, ear canal and external ear normal.     Left Ear: Tympanic membrane, ear canal and external ear normal.     Nose: Nose normal.     Mouth/Throat:     Mouth: Mucous membranes are moist.     Pharynx: Oropharynx is clear. No oropharyngeal exudate.  Eyes:     General: No scleral icterus.    Conjunctiva/sclera: Conjunctivae normal.     Pupils:  Pupils are equal, round, and reactive to light.  Neck:     Thyroid : No thyromegaly.  Cardiovascular:     Rate and Rhythm: Normal rate and regular rhythm.     Heart sounds: Normal heart sounds. No murmur heard. Pulmonary:     Effort: Pulmonary effort is normal. No respiratory distress.     Breath sounds: Normal breath sounds. No wheezing or rales.  Abdominal:     General: There is no distension.     Palpations: Abdomen is soft.     Tenderness: There is no abdominal tenderness.  Musculoskeletal:        General: No deformity.     Cervical back: Neck supple.     Right lower leg: No edema.     Left lower leg: No edema.  Lymphadenopathy:     Cervical: No cervical adenopathy.  Skin:    General: Skin is warm and dry.     Findings: Rash (intertrigo in gluteal cleft) present.  Neurological:     Mental Status: He is alert and oriented to person, place, and time. Mental status is at baseline.     Gait: Gait normal.  Psychiatric:        Mood and Affect: Mood normal.        Behavior: Behavior normal.        Thought Content: Thought content normal.     Activities of Daily Living    12/03/2023    8:40 AM  In your present state of health, do you have any difficulty performing the following activities:  Hearing? 0  Vision? 0  Difficulty concentrating or making decisions? 0  Walking or climbing stairs? 0  Dressing or bathing? 0  Doing errands, shopping? 0  Preparing Food and eating ? N  Using the Toilet? N  In the past six months, have you accidently leaked urine? N  Do you have problems with loss of bowel control? N  Managing your Medications? N  Managing your Finances? N  Housekeeping or managing your Housekeeping? N    Fall Risk Assessment  12/03/2023    8:40 AM 10/24/2022    9:30 AM 08/24/2022    3:32 PM 07/24/2022   10:21 AM 08/02/2021   10:52 AM  Fall Risk   Falls in the past year? 0 0 0 0 0  Number falls in past yr: 0 0 0 0 0  Injury with Fall? 0 0 0 0 0  Risk for fall  due to :  No Fall Risks No Fall Risks No Fall Risks No Fall Risks  Follow up  Falls prevention discussed;Education provided;Falls evaluation completed Falls evaluation completed Falls evaluation completed Falls evaluation completed      Data saved with a previous flowsheet row definition     Depression Screen    12/04/2023   10:39 AM 10/24/2022    9:31 AM 08/24/2022    3:32 PM 07/24/2022   10:21 AM  PHQ 2/9 Scores  PHQ - 2 Score 0 0 0 0  PHQ- 9 Score 0 0 0 0       12/04/2023   10:13 AM  6CIT Screen  What Year? 0 points  What month? 0 points  What time? 0 points  Count back from 20 0 points  Months in reverse 0 points  Repeat phrase 2 points  Total Score 2 points    EKG: NSR  Vision Screening   Right eye Left eye Both eyes  Without correction   20/20  With correction        No results found for any visits on 12/04/23.  Assessment & Plan      Initial Preventative Physical Exam  Reviewed patient's Family Medical History Reviewed and updated list of patient's medical providers Assessment of cognitive impairment was done Assessed patient's functional ability Established a written schedule for health screening services Health Risk Assessent Completed and Reviewed  Exercise Activities and Dietary recommendations  Goals   None     Immunization History  Administered Date(s) Administered   Fluad Quad(high Dose 65+) 01/13/2021, 02/21/2022   Hepatitis A 04/06/2010, 12/09/2010   Hepatitis B, ADULT 03/28/2016, 05/16/2016, 09/27/2016   Influenza, High Dose Seasonal PF 01/08/2020   Influenza,inj,Quad PF,6+ Mos 02/11/2019   Influenza-Unspecified 01/30/2015, 01/09/2017, 02/19/2018   MMR 03/11/2018   Moderna Covid-19 Vaccine  Bivalent Booster 59yrs & up 02/21/2022   PFIZER(Purple Top)SARS-COV-2 Vaccination 06/18/2019, 07/09/2019, 02/04/2020, 09/10/2020, 01/15/2021   PNEUMOCOCCAL CONJUGATE-20 08/02/2021   Pneumococcal Polysaccharide-23 01/08/2020   Tdap 11/18/2013,  10/24/2022   Zoster Recombinant(Shingrix ) 03/11/2018, 06/11/2018    Health Maintenance  Topic Date Due   COVID-19 Vaccine (7 - 2024-25 season) 12/31/2022   INFLUENZA VACCINE  07/29/2024 (Originally 11/30/2023)   Medicare Annual Wellness (AWV)  12/03/2024   Colonoscopy  04/11/2029   DTaP/Tdap/Td (3 - Td or Tdap) 10/23/2032   Pneumococcal Vaccine: 50+ Years  Completed   Hepatitis B Vaccines  Completed   Hepatitis C Screening  Completed   Zoster Vaccines- Shingrix   Completed   HPV VACCINES  Aged Out   Meningococcal B Vaccine  Aged Out     Discussed health benefits of physical activity, and encouraged him to engage in regular exercise appropriate for his age and condition.   Problem List Items Addressed This Visit       Cardiovascular and Mediastinum   (HFpEF) heart failure with preserved ejection fraction (HCC)     Endocrine   Hypothyroidism   Relevant Orders   TSH     Other   Hypercholesteremia   Relevant Orders   Hepatic function panel   Lipid panel  Other Visit Diagnoses       Welcome to Medicare preventive visit    -  Primary     Prostate cancer screening       Relevant Orders   PSA Total (Reflex To Free)     Candidal intertrigo       Relevant Medications   nystatin  ointment (MYCOSTATIN )       Assessment and Plan    Intertrigo with secondary candidiasis, sacrococcygeal region Chronic intertrigo with secondary candidiasis in the sacrococcygeal region, presenting as a long ridge likely due to yeast infection. The skin is fragile and has split. The condition is improving with current treatment but requires further management. - Refill nystatin  cream for yeast infection - Instruct to apply nystatin  first, followed by Desitin - Continue treatment until the area is healed  Heart failure with preserved ejection fraction Heart failure with preserved ejection fraction, with effective cardiac contractility. Current management includes medications to reduce  exacerbations and prevent hospitalization. Weight loss is encouraged to reduce cardiac load. - Continue current medications including spironolactone  and Farxiga  - Encourage further weight loss to reduce cardiac load  Abnormal weight gain Recent weight gain despite adherence to a strict diet. Possible factors include dietary changes and natural body weight stabilization. Thyroid  function will be assessed to rule out any contribution to weight changes. - Check thyroid  function as part of lab work  Impaired wound healing Reports of slower wound healing and frequent infections. Neosporin is not recommended due to potential for contact dermatitis. Bacitracin is suggested as a better alternative. - Switch from Neosporin to bacitracin for wound care - Educate on using soap and water for wound cleaning  Mild subjective cognitive decline Mild subjective cognitive decline with occasional difficulty recalling words, not significant enough to be noticed by family or interfere with daily activities. Possibly related to reduced cognitive engagement post-retirement. - Monitor cognitive function          Return in about 1 year (around 12/03/2024) for AWV, CPE.     Jon Eva, MD  [facility] [phone] (phone) [phone] (fax)  [facility]

## 2023-12-05 ENCOUNTER — Encounter: Payer: Self-pay | Admitting: Family Medicine

## 2023-12-05 LAB — LIPID PANEL
Chol/HDL Ratio: 3.2 ratio (ref 0.0–5.0)
Cholesterol, Total: 146 mg/dL (ref 100–199)
HDL: 46 mg/dL (ref >39)
LDL Chol Calc (NIH): 82 mg/dL (ref 0–99)
Triglycerides: 95 mg/dL (ref 0–149)
VLDL Cholesterol Cal: 18 mg/dL (ref 5–40)

## 2023-12-05 LAB — HEPATIC FUNCTION PANEL
ALT: 61 IU/L — ABNORMAL HIGH (ref 0–44)
AST: 38 IU/L (ref 0–40)
Albumin: 4.7 g/dL (ref 3.9–4.9)
Alkaline Phosphatase: 95 IU/L (ref 44–121)
Bilirubin Total: 0.4 mg/dL (ref 0.0–1.2)
Bilirubin, Direct: 0.14 mg/dL (ref 0.00–0.40)
Total Protein: 7.1 g/dL (ref 6.0–8.5)

## 2023-12-05 LAB — PSA TOTAL (REFLEX TO FREE): Prostate Specific Ag, Serum: 1.3 ng/mL (ref 0.0–4.0)

## 2023-12-05 LAB — TSH: TSH: 4.38 u[IU]/mL (ref 0.450–4.500)

## 2023-12-06 ENCOUNTER — Ambulatory Visit: Payer: Self-pay | Admitting: Family Medicine

## 2023-12-06 DIAGNOSIS — R7989 Other specified abnormal findings of blood chemistry: Secondary | ICD-10-CM

## 2023-12-06 NOTE — Telephone Encounter (Signed)
 See result note - was called by CMA

## 2023-12-17 NOTE — Addendum Note (Signed)
 Addended by: THELBERT EULALIO HERO on: 12/17/2023 08:42 AM   Modules accepted: Orders

## 2023-12-18 ENCOUNTER — Ambulatory Visit (INDEPENDENT_AMBULATORY_CARE_PROVIDER_SITE_OTHER): Admitting: Family Medicine

## 2023-12-18 ENCOUNTER — Encounter: Payer: Self-pay | Admitting: Family Medicine

## 2023-12-18 VITALS — BP 117/85 | HR 65 | Ht 75.0 in | Wt 254.0 lb

## 2023-12-18 DIAGNOSIS — W57XXXA Bitten or stung by nonvenomous insect and other nonvenomous arthropods, initial encounter: Secondary | ICD-10-CM

## 2023-12-18 DIAGNOSIS — T7840XA Allergy, unspecified, initial encounter: Secondary | ICD-10-CM

## 2023-12-18 DIAGNOSIS — S80262A Insect bite (nonvenomous), left knee, initial encounter: Secondary | ICD-10-CM

## 2023-12-18 MED ORDER — CLOBETASOL PROPIONATE 0.05 % EX OINT: 1.0000 | TOPICAL_OINTMENT | Freq: Two times a day (BID) | CUTANEOUS | 0 refills | Status: AC

## 2023-12-18 NOTE — Progress Notes (Signed)
      Acute visit   Patient: Michael Reyes   DOB: 12-01-1955   68 y.o. Male  MRN: [id number] PCP: Reyes Michael HERO, MD   Chief Complaint  Patient presents with   Rash    Itchy red rash behind his L knee its been there for about a week. He thinks its started out as a rash    Subjective    Discussed the use of AI scribe software for clinical note transcription with the patient, who gave verbal consent to proceed.  History of Present Illness   Michael Reyes Michael Reyes is a 68 year old male who presents with a spreading rash following a bug bite.  Approximately a week and a half ago, he noticed a small, red, itchy bug bite. The rash has since spread downwards and laterally from the original site and remains itchy. He has been using triamcinolone for the past two days without significant relief and previously tried an antifungal cream without improvement. He has not been scratching the rash excessively and experiences no pain. He is concerned about the rash worsening before an upcoming trip, as he has experienced similar issues in the past while traveling.        Review of Systems  Objective    BP 117/85   Pulse 65   Ht 6' 3 (1.905 m)   Wt 254 lb (115.2 kg)   SpO2 98%   BMI 31.75 kg/m  Physical Exam Skin:    Comments: Erythematous, maculopapular in popliteal fossa       No results found for any visits on 12/18/23.  Assessment & Plan     Problem List Items Addressed This Visit   None Visit Diagnoses       Allergic reaction, initial encounter    -  Primary     Bug bite without infection, initial encounter               Allergic skin reaction following insect bite Allergic skin reaction likely triggered by an insect bite approximately one and a half weeks ago. The rash is located in the crease of the knee, exacerbating its appearance. It is itchy but not infected, with no tenderness or widespread redness. Differential diagnosis included fungal  infection, ruled out due to ineffective antifungal treatment. The reaction is inconsistent with a black widow spider bite, as there is no necrotic center. Current treatment with triamcinolone has been ongoing for two days, with expected improvement in at least a week. - Continue triamcinolone cream application twice daily. - Prescribe clobetasol  ointment for stronger steroid treatment if needed. - Advise covering the rash with a Band-Aid to prevent scratching. - Recommend oral antihistamines like Zyrtec or Claritin  for itch relief if necessary. - Monitor for signs of infection, such as spreading redness or increased swelling, and report if these occur.       Meds ordered this encounter  Medications   clobetasol  ointment (TEMOVATE ) 0.05 %    Sig: Apply 1 Application topically 2 (two) times daily.    Dispense:  30 g    Refill:  0     Return if symptoms worsen or fail to improve.      Michael Myrla, MD  [facility] [phone] (phone) [phone] (fax)  [facility]

## 2023-12-25 DIAGNOSIS — R7989 Other specified abnormal findings of blood chemistry: Secondary | ICD-10-CM | POA: Diagnosis not present

## 2023-12-26 LAB — HEPATIC FUNCTION PANEL
ALT: 71 IU/L — ABNORMAL HIGH (ref 0–44)
AST: 40 IU/L (ref 0–40)
Albumin: 4.5 g/dL (ref 3.9–4.9)
Alkaline Phosphatase: 99 IU/L (ref 44–121)
Bilirubin Total: 0.8 mg/dL (ref 0.0–1.2)
Bilirubin, Direct: 0.24 mg/dL (ref 0.00–0.40)
Total Protein: 6.9 g/dL (ref 6.0–8.5)

## 2023-12-27 ENCOUNTER — Encounter: Payer: Self-pay | Admitting: Family Medicine

## 2023-12-27 NOTE — Telephone Encounter (Signed)
Please see the pt message below

## 2023-12-28 NOTE — Addendum Note (Signed)
 Addended by: Haiven Nardone E on: 12/28/2023 11:42 AM   Modules accepted: Orders

## 2024-01-04 ENCOUNTER — Encounter: Payer: Self-pay | Admitting: Family Medicine

## 2024-02-05 ENCOUNTER — Ambulatory Visit
Admission: RE | Admit: 2024-02-05 | Discharge: 2024-02-05 | Disposition: A | Discharge status: Home / Self Care | Source: Ambulatory Visit | Attending: Family Medicine | Admitting: Family Medicine

## 2024-02-05 DIAGNOSIS — R7989 Other specified abnormal findings of blood chemistry: Secondary | ICD-10-CM | POA: Insufficient documentation

## 2024-02-05 DIAGNOSIS — R748 Abnormal levels of other serum enzymes: Secondary | ICD-10-CM | POA: Diagnosis not present

## 2024-02-21 ENCOUNTER — Ambulatory Visit: Payer: Self-pay

## 2024-02-21 NOTE — Telephone Encounter (Signed)
 Patient called with concerns of moderate cough. Wife tested positive for COVID but patient didn't test. Endorses congestion along with cough. Patient is asking for recommendations from provider.   FYI Only or Action Required?: Action required by provider: clinical question for provider.  Patient was last seen in primary care on 12/18/2023 by Myrla Jon HERO, MD.  Called Nurse Triage reporting Cough.  Symptoms began 02/10/2024.  Interventions attempted: OTC medications: DayQuil and Rest, hydration, or home remedies.  Symptoms are: unchanged.  Triage Disposition: See Physician Within 24 Hours  Patient/caregiver understands and will follow disposition?: No, wishes to speak with PCP  Copied from CRM [id number]. Topic: Clinical - Red Word Triage >> Feb 21, 2024 11:16 AM Shanda MATSU wrote: Red Word that prompted transfer to Nurse Triage: Patient 's wife, LILY Delrosario, reporting patient having SOB followed by coughing. Reason for Disposition  [1] Continuous (nonstop) coughing interferes with work or school AND [2] no improvement using cough treatment per Care Advice  Answer Assessment - Initial Assessment Questions 1. ONSET: When did the cough begin?      Started 02/10/2024 2. SEVERITY: How bad is the cough today?      moderate 3. SPUTUM: Describe the color of your sputum (e.g., none, dry cough; clear, white, yellow, green)     Dry but occasionally productive 4. HEMOPTYSIS: Are you coughing up any blood? If Yes, ask: How much? (e.g., flecks, streaks, tablespoons, etc.)     no 5. DIFFICULTY BREATHING: Are you having difficulty breathing? If Yes, ask: How bad is it? (e.g., mild, moderate, severe)      no 6. FEVER: Do you have a fever? If Yes, ask: What is your temperature, how was it measured, and when did it start?     No fever currently 7. CARDIAC HISTORY: Do you have any history of heart disease? (e.g., heart attack, congestive heart failure)      no 8. LUNG  HISTORY: Do you have any history of lung disease?  (e.g., pulmonary embolus, asthma, emphysema)     no 9. PE RISK FACTORS: Do you have a history of blood clots? (or: recent major surgery, recent prolonged travel, bedridden)     no 10. OTHER SYMPTOMS: Do you have any other symptoms? (e.g., runny nose, wheezing, chest pain)       Runny nose 12. TRAVEL: Have you traveled out of the country in the last month? (e.g., travel history, exposures)       Came back from [place] on Oct 1  Protocols used: Cough - Acute Non-Productive-A-AH

## 2024-02-26 NOTE — Telephone Encounter (Signed)
 Lvm regarding provider recommendation. No answer when called.

## 2024-03-04 DIAGNOSIS — G4733 Obstructive sleep apnea (adult) (pediatric): Secondary | ICD-10-CM | POA: Diagnosis not present

## 2024-03-07 ENCOUNTER — Other Ambulatory Visit: Payer: Self-pay | Admitting: Family Medicine

## 2024-03-10 NOTE — Telephone Encounter (Signed)
 Requested Prescriptions  Pending Prescriptions Disp Refills   allopurinol  (ZYLOPRIM ) 100 MG tablet [Pharmacy Med Name: ALLOPURINOL  100 MG TAB] 180 tablet 0    Sig: TAKE ONE TABLET BY MOUTH TWICE DAILY     Endocrinology:  Gout Agents - allopurinol  Failed - 03/10/2024 12:08 PM      Failed - Uric Acid in normal range and within 360 days    Uric Acid  Date Value Ref Range Status  08/24/2022 4.7 3.8 - 8.4 mg/dL Final    Comment:               Therapeutic target for gout patients: <6.0         Failed - Cr in normal range and within 360 days    Creatinine, Ser  Date Value Ref Range Status  11/27/2022 1.02 0.76 - 1.27 mg/dL Final         Failed - CBC within normal limits and completed in the last 12 months    WBC  Date Value Ref Range Status  07/26/2022 10.4  Final   RBC  Date Value Ref Range Status  07/26/2022 5.04 3.87 - 5.11 Final   Hemoglobin  Date Value Ref Range Status  07/26/2022 16.1 13.5 - 17.5 Final  03/20/2019 17.3 13.0 - 17.7 g/dL Final   HCT  Date Value Ref Range Status  07/26/2022 49 41 - 53 Final   Hematocrit  Date Value Ref Range Status  03/20/2019 51.5 (H) 37.5 - 51.0 % Final   MCHC  Date Value Ref Range Status  03/20/2019 33.6 31.5 - 35.7 g/dL Final   [facility]  Date Value Ref Range Status  03/20/2019 32.3 26.6 - 33.0 pg Final   MCV  Date Value Ref Range Status  03/20/2019 96 79 - 97 fL Final   No results found for: PLTCOUNTKUC, LABPLAT, POCPLA RDW  Date Value Ref Range Status  03/20/2019 12.4 11.6 - 15.4 % Final         Passed - Valid encounter within last 12 months    Recent Outpatient Visits           2 months ago Allergic reaction, initial encounter   [facility] [place], Jon HERO, MD   3 months ago Welcome to [employer] preventive visit   [facility] [place], Jon HERO, MD       Future Appointments             In 9 months Bacigalupo, Jon HERO, MD [facility] [place]

## 2024-04-07 ENCOUNTER — Other Ambulatory Visit: Payer: Self-pay | Admitting: Family Medicine

## 2024-04-25 IMAGING — CR DG FOOT COMPLETE 3+V*L*
1 series · 3 of 3 positions shown · non-contrast
Comparison: 01/25/2016

CLINICAL DATA: Second digit pain and bruising.

EXAM:
LEFT FOOT - COMPLETE 3+ VIEW

[Series 1: dg foot complete left · 0.14mm/px · 3 of 3 slices shown]
[im 1/3]
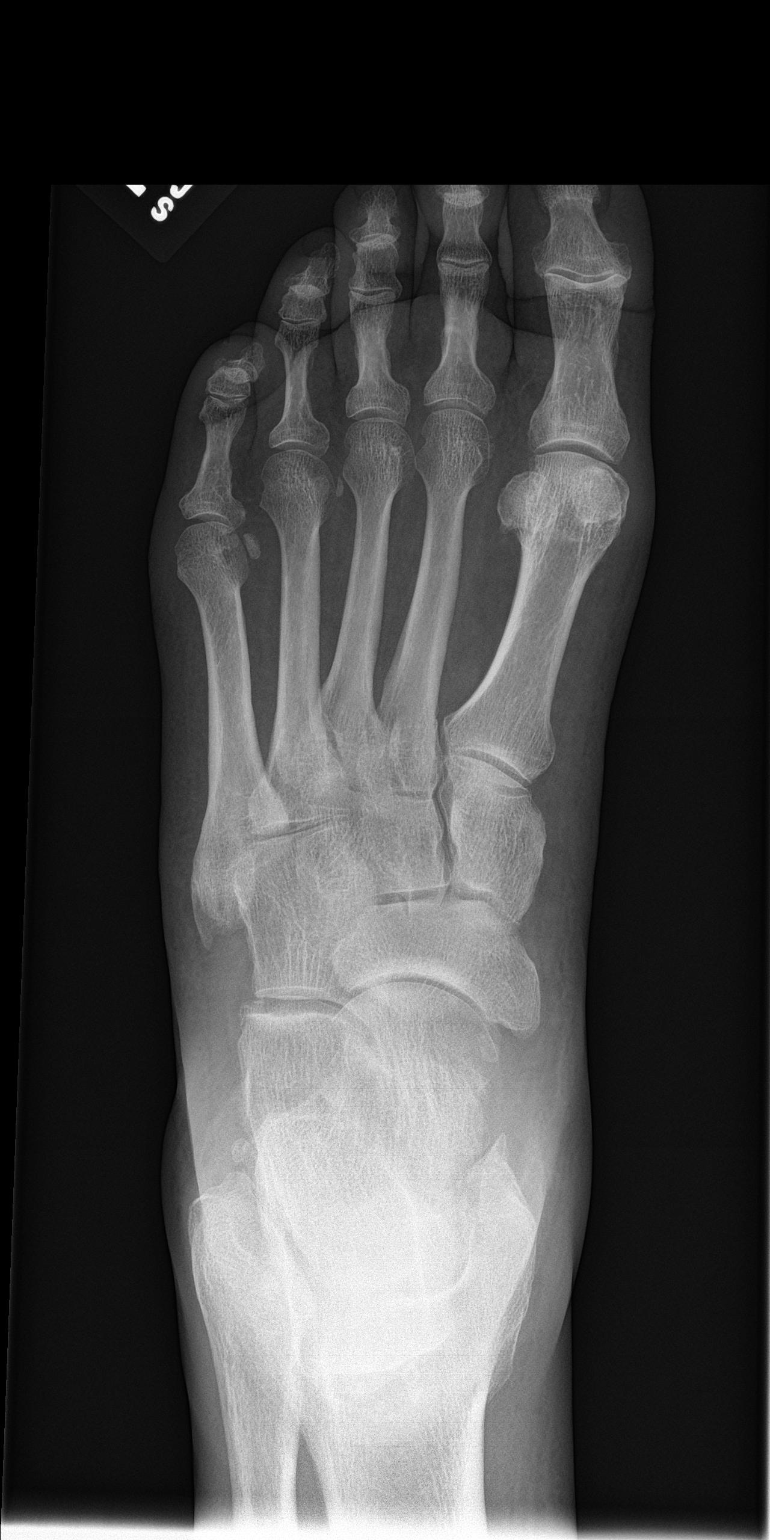
[im 2/3]
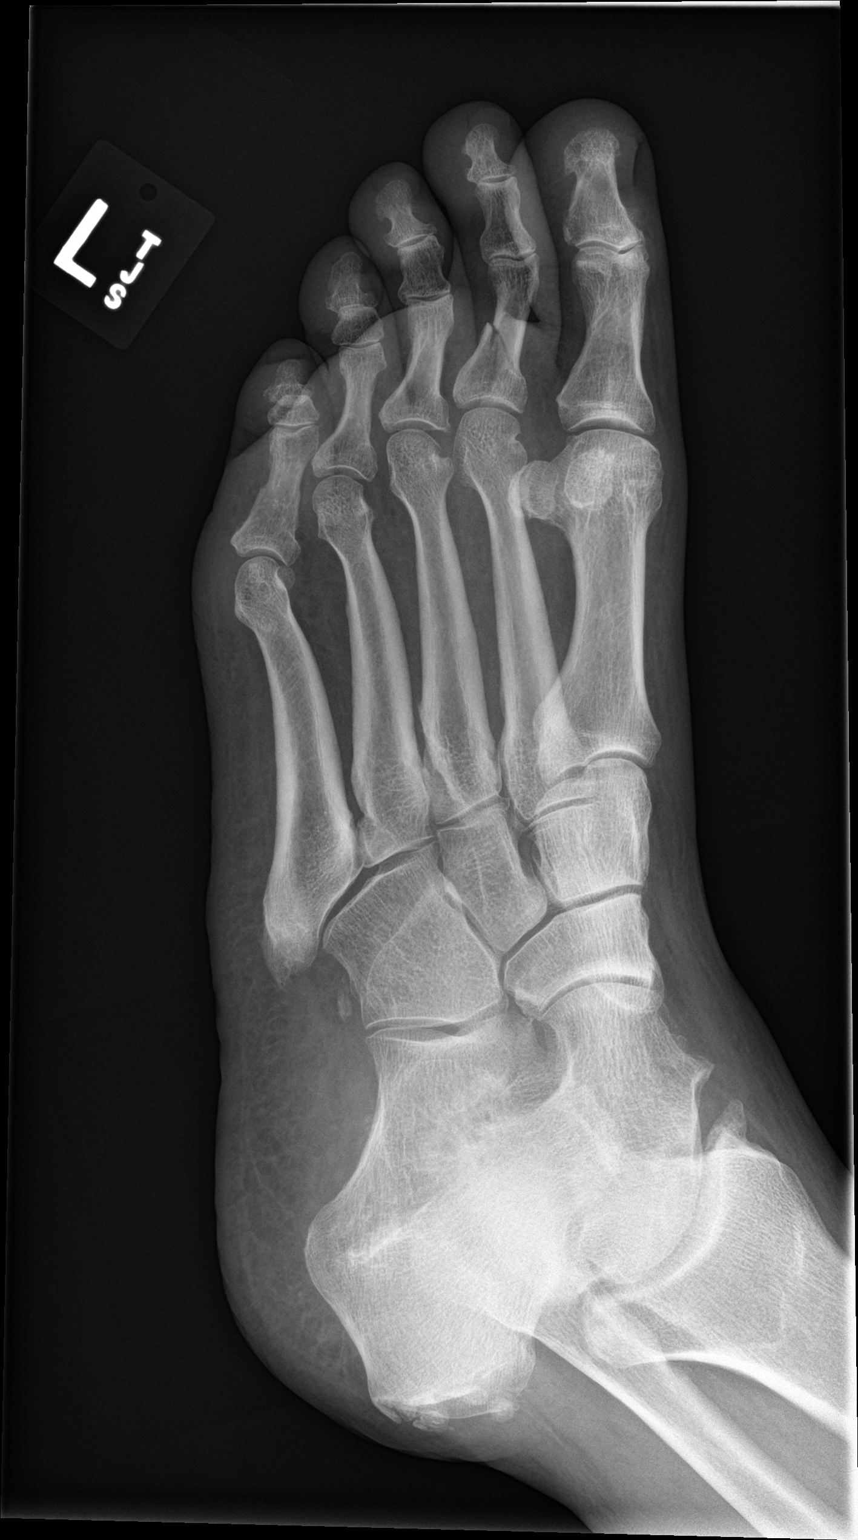
[im 3/3]
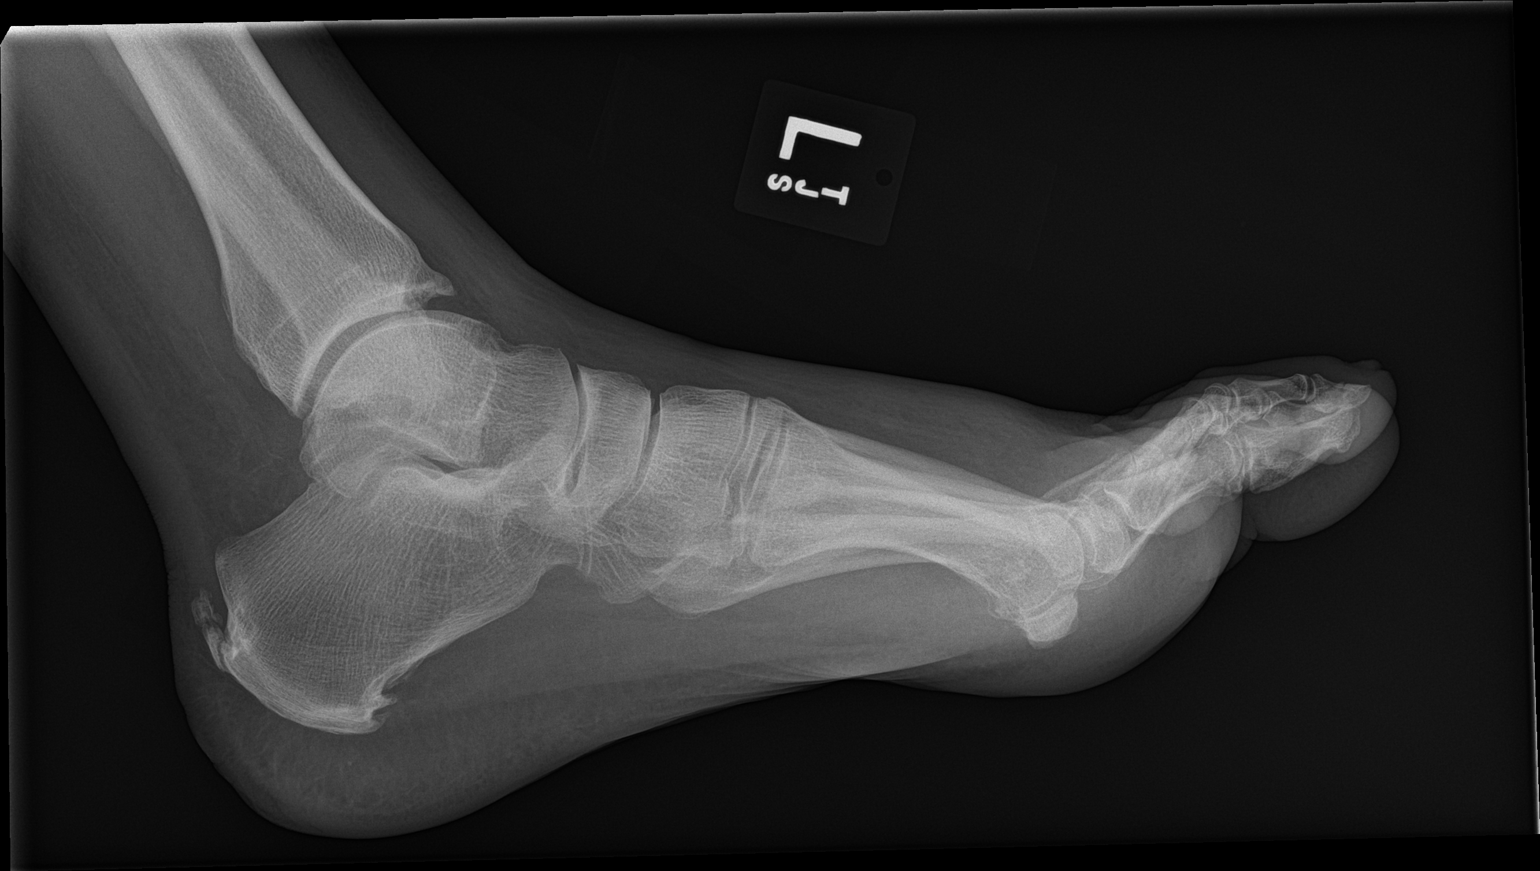

[3 of 3 positions shown; findings below may reference images not displayed]

FINDINGS: Oblique fracture through the shaft of the second proximal phalanx,
essentially nondisplaced. On the oblique view there is continuation
proximally to the level of the non dislocated PIP joint. Suspect
remote fifth proximal phalanx shaft fracture.
IMPRESSION: Acute shaft and base fracture of the second proximal phalanx.

## 2024-04-25 IMAGING — CR DG CHEST 2V
1 series · 2 of 2 positions shown · non-contrast
Comparison: October 01, 2015.

CLINICAL DATA: Follow-up pneumonia.

EXAM:
CHEST - 2 VIEW

[Series 1: dg chest 2 view · 0.14mm/px · 2 of 2 slices shown]
[im 1/2]
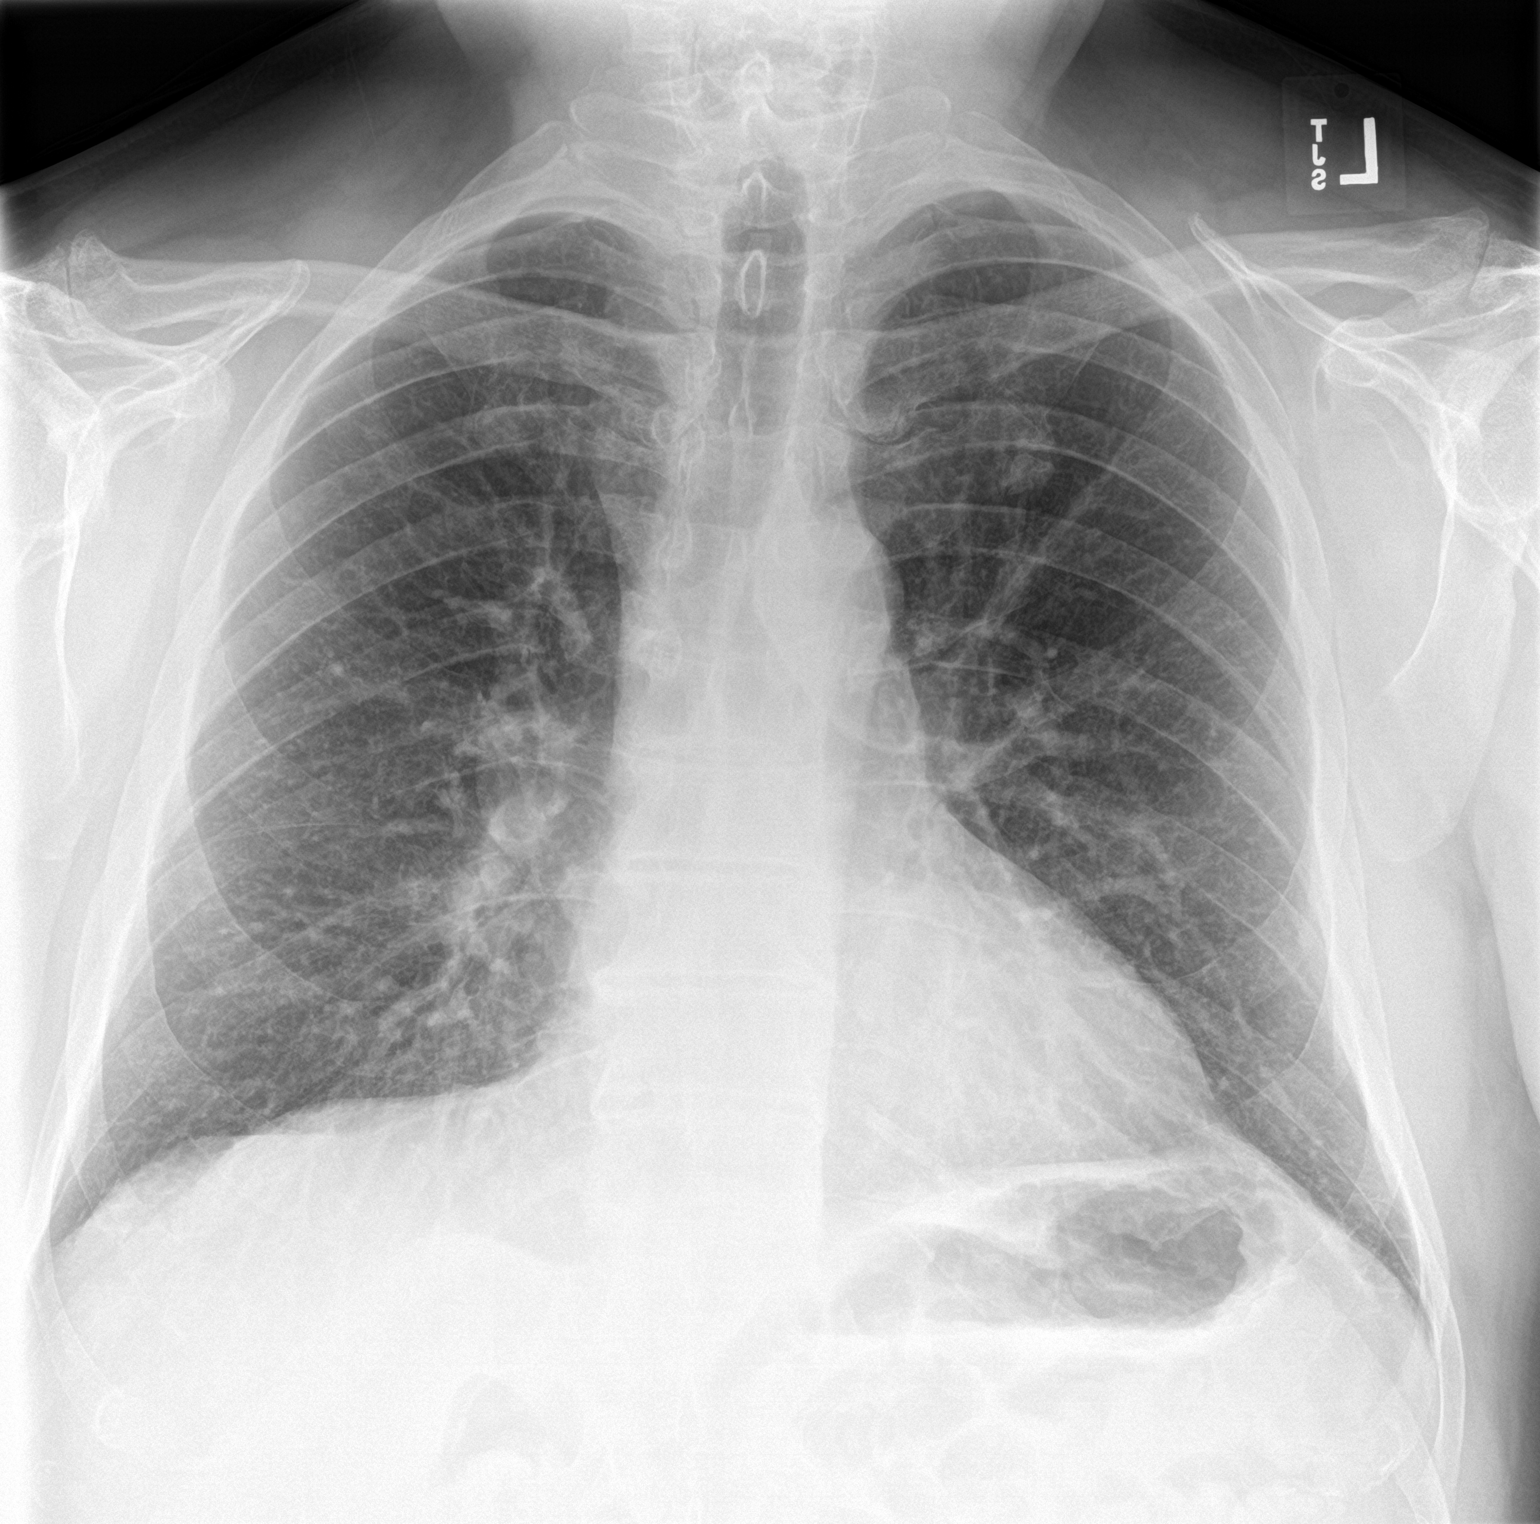
[im 2/2]
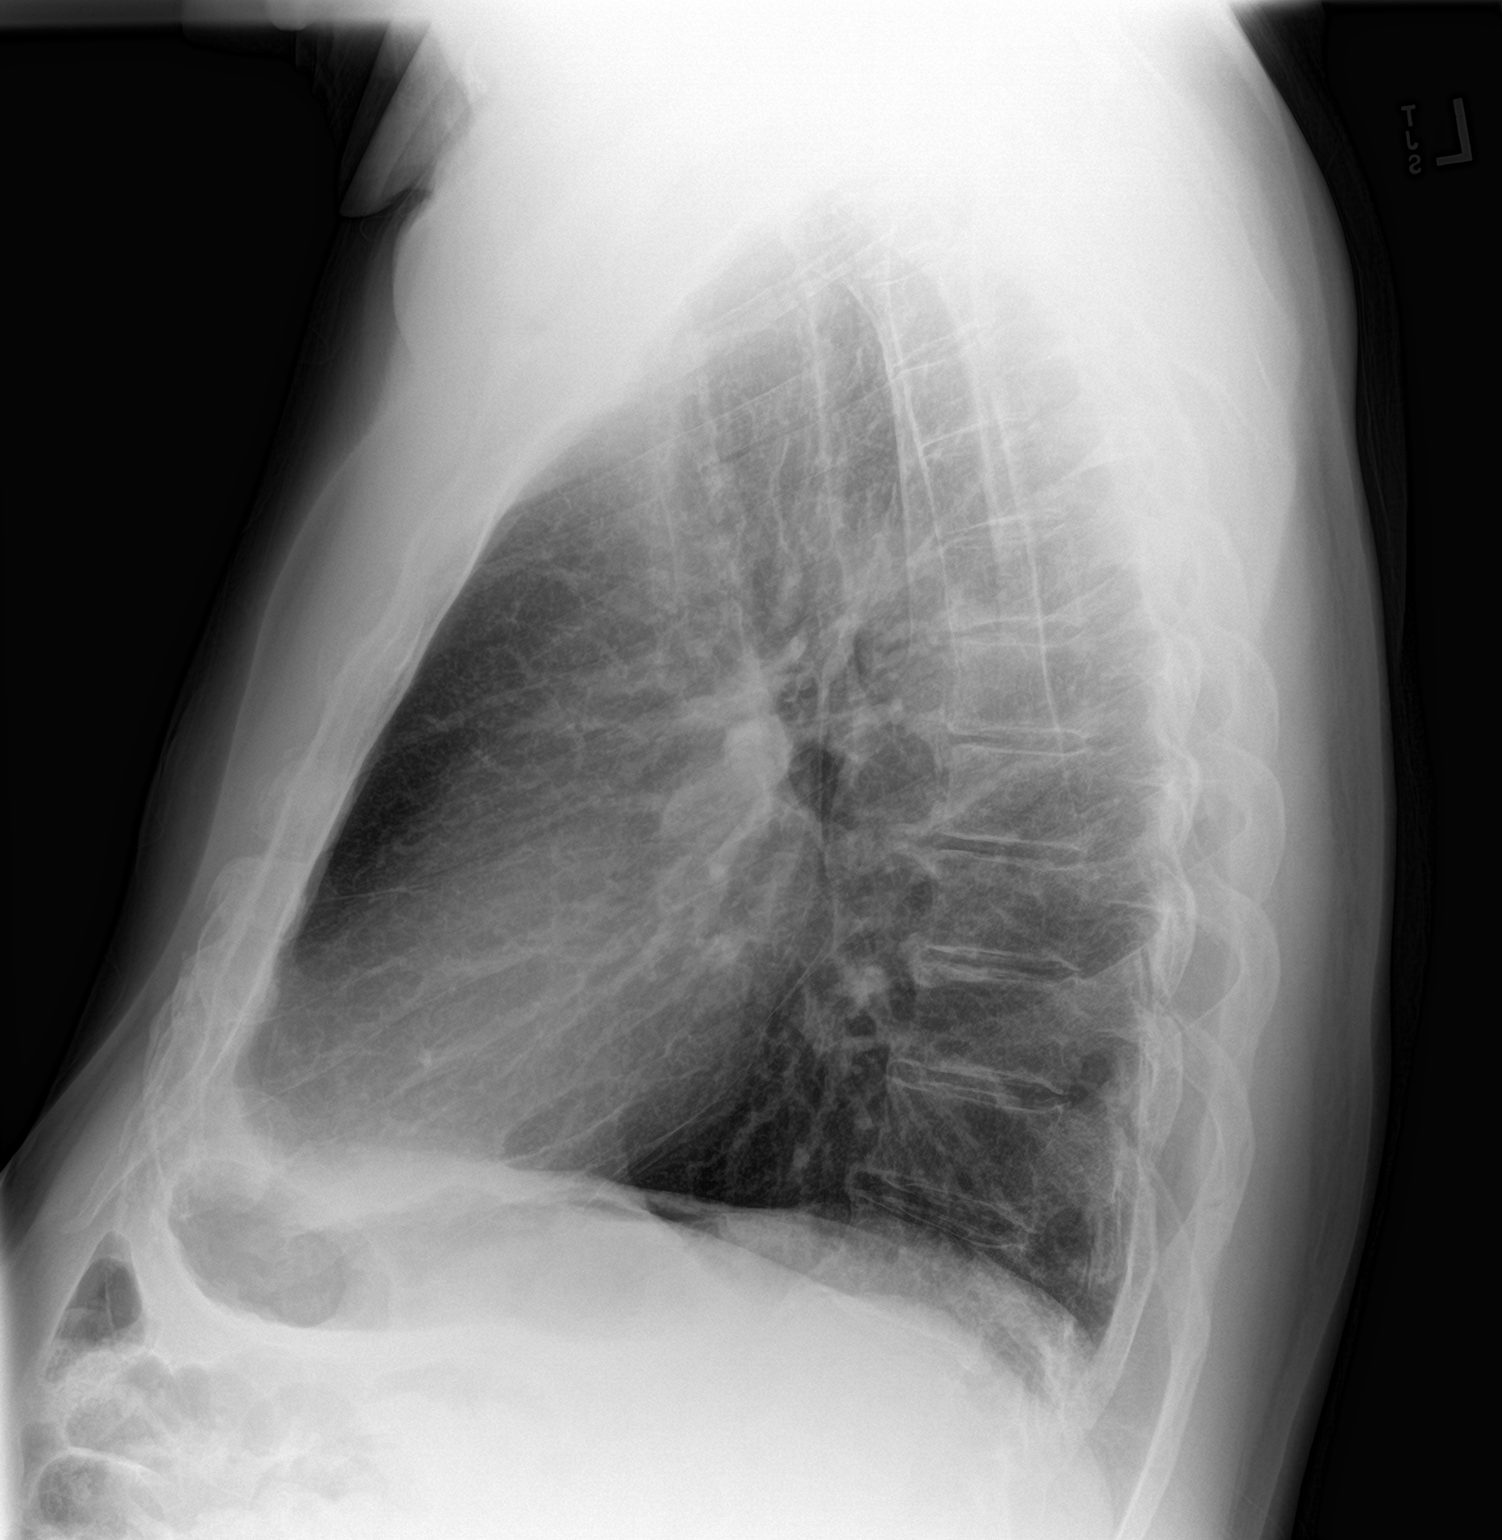

[2 of 2 positions shown; findings below may reference images not displayed]

FINDINGS: No consolidation. No visible pleural effusions or pneumothorax.
Cardiomediastinal silhouette is within normal limits and similar to
prior. No displaced fracture.
IMPRESSION: No evidence of acute cardiopulmonary disease.

## 2024-05-13 ENCOUNTER — Encounter: Payer: Self-pay | Admitting: Family Medicine

## 2024-12-16 ENCOUNTER — Ambulatory Visit

## 2024-12-16 ENCOUNTER — Encounter: Admitting: Family Medicine
